# Patient Record
Sex: Male | Born: 1935 | Race: Black or African American | Hispanic: No | Marital: Married | Smoking: Never smoker
Health system: Southern US, Community
[De-identification: ages and names within clinical notes are randomized; demographics above are authoritative.]

## PROBLEM LIST (undated history)

## (undated) DIAGNOSIS — I2510 Atherosclerotic heart disease of native coronary artery without angina pectoris: Secondary | ICD-10-CM

## (undated) DIAGNOSIS — N189 Chronic kidney disease, unspecified: Secondary | ICD-10-CM

## (undated) DIAGNOSIS — N186 End stage renal disease: Secondary | ICD-10-CM

## (undated) DIAGNOSIS — M1990 Unspecified osteoarthritis, unspecified site: Secondary | ICD-10-CM

## (undated) DIAGNOSIS — D649 Anemia, unspecified: Secondary | ICD-10-CM

## (undated) DIAGNOSIS — R413 Other amnesia: Secondary | ICD-10-CM

## (undated) DIAGNOSIS — F32A Depression, unspecified: Secondary | ICD-10-CM

## (undated) DIAGNOSIS — K219 Gastro-esophageal reflux disease without esophagitis: Secondary | ICD-10-CM

## (undated) DIAGNOSIS — I10 Essential (primary) hypertension: Secondary | ICD-10-CM

## (undated) DIAGNOSIS — I219 Acute myocardial infarction, unspecified: Secondary | ICD-10-CM

## (undated) DIAGNOSIS — Z8719 Personal history of other diseases of the digestive system: Secondary | ICD-10-CM

## (undated) DIAGNOSIS — F329 Major depressive disorder, single episode, unspecified: Secondary | ICD-10-CM

## (undated) DIAGNOSIS — R0602 Shortness of breath: Secondary | ICD-10-CM

## (undated) DIAGNOSIS — I639 Cerebral infarction, unspecified: Secondary | ICD-10-CM

## (undated) DIAGNOSIS — J189 Pneumonia, unspecified organism: Secondary | ICD-10-CM

## (undated) HISTORY — DX: Cerebral infarction, unspecified: I63.9

## (undated) HISTORY — DX: Other amnesia: R41.3

## (undated) HISTORY — DX: Chronic kidney disease, unspecified: N18.9

## (undated) HISTORY — DX: Atherosclerotic heart disease of native coronary artery without angina pectoris: I25.10

## (undated) HISTORY — PX: ARTERIOVENOUS GRAFT PLACEMENT: SUR1029

## (undated) HISTORY — PX: DIALYSIS FISTULA CREATION: SHX611

## (undated) HISTORY — PX: LAPAROSCOPIC PARTIAL HEPATECTOMY: SHX5909

## (undated) HISTORY — DX: Essential (primary) hypertension: I10

---

## 2000-08-26 HISTORY — PX: CORONARY ARTERY BYPASS GRAFT: SHX141

## 2000-09-10 ENCOUNTER — Inpatient Hospital Stay (HOSPITAL_COMMUNITY): Admission: AD | Admit: 2000-09-10 | Discharge: 2000-09-22 | Payer: Self-pay | Admitting: *Deleted

## 2000-09-11 ENCOUNTER — Encounter: Payer: Self-pay | Admitting: *Deleted

## 2000-09-13 ENCOUNTER — Encounter: Payer: Self-pay | Admitting: *Deleted

## 2000-09-14 ENCOUNTER — Encounter: Payer: Self-pay | Admitting: Thoracic Surgery (Cardiothoracic Vascular Surgery)

## 2000-09-15 ENCOUNTER — Encounter: Payer: Self-pay | Admitting: Cardiothoracic Surgery

## 2000-09-16 ENCOUNTER — Encounter: Payer: Self-pay | Admitting: Cardiothoracic Surgery

## 2000-09-17 ENCOUNTER — Encounter: Payer: Self-pay | Admitting: Cardiothoracic Surgery

## 2000-09-18 ENCOUNTER — Encounter: Payer: Self-pay | Admitting: Cardiothoracic Surgery

## 2000-09-19 ENCOUNTER — Encounter: Payer: Self-pay | Admitting: Cardiothoracic Surgery

## 2000-09-20 ENCOUNTER — Encounter: Payer: Self-pay | Admitting: Cardiothoracic Surgery

## 2004-01-04 ENCOUNTER — Ambulatory Visit (HOSPITAL_COMMUNITY): Admission: RE | Admit: 2004-01-04 | Discharge: 2004-01-04 | Payer: Self-pay | Admitting: Family Medicine

## 2009-05-22 ENCOUNTER — Ambulatory Visit: Payer: Self-pay | Admitting: Cardiovascular Disease

## 2009-06-05 ENCOUNTER — Ambulatory Visit: Payer: Self-pay

## 2009-06-05 ENCOUNTER — Encounter: Payer: Self-pay | Admitting: Cardiovascular Disease

## 2009-06-05 DIAGNOSIS — N189 Chronic kidney disease, unspecified: Secondary | ICD-10-CM

## 2009-06-20 ENCOUNTER — Telehealth (INDEPENDENT_AMBULATORY_CARE_PROVIDER_SITE_OTHER): Payer: Self-pay

## 2009-06-21 ENCOUNTER — Ambulatory Visit: Payer: Self-pay

## 2009-06-21 ENCOUNTER — Ambulatory Visit: Payer: Self-pay | Admitting: Cardiology

## 2009-06-21 ENCOUNTER — Encounter: Payer: Self-pay | Admitting: Cardiovascular Disease

## 2009-06-21 ENCOUNTER — Ambulatory Visit (HOSPITAL_COMMUNITY): Admission: RE | Admit: 2009-06-21 | Discharge: 2009-06-21 | Payer: Self-pay | Admitting: Cardiovascular Disease

## 2009-06-28 ENCOUNTER — Telehealth: Payer: Self-pay | Admitting: Cardiovascular Disease

## 2009-06-28 ENCOUNTER — Ambulatory Visit: Payer: Self-pay | Admitting: Cardiovascular Disease

## 2009-09-28 ENCOUNTER — Ambulatory Visit: Payer: Self-pay | Admitting: Cardiovascular Disease

## 2009-12-18 ENCOUNTER — Ambulatory Visit: Payer: Self-pay | Admitting: Surgery

## 2009-12-27 ENCOUNTER — Ambulatory Visit (HOSPITAL_COMMUNITY): Admission: RE | Admit: 2009-12-27 | Discharge: 2009-12-27 | Payer: Self-pay | Admitting: Surgery

## 2010-01-08 ENCOUNTER — Ambulatory Visit: Payer: Self-pay | Admitting: Surgery

## 2010-01-25 ENCOUNTER — Ambulatory Visit: Payer: Self-pay | Admitting: Cardiovascular Disease

## 2010-02-12 ENCOUNTER — Ambulatory Visit: Payer: Self-pay | Admitting: Surgery

## 2010-04-09 ENCOUNTER — Ambulatory Visit: Payer: Self-pay | Admitting: Surgery

## 2010-04-18 ENCOUNTER — Ambulatory Visit (HOSPITAL_COMMUNITY): Admission: RE | Admit: 2010-04-18 | Discharge: 2010-04-18 | Payer: Self-pay | Admitting: Surgery

## 2010-04-18 ENCOUNTER — Ambulatory Visit: Payer: Self-pay | Admitting: Surgery

## 2010-04-27 ENCOUNTER — Ambulatory Visit (HOSPITAL_COMMUNITY)
Admission: RE | Admit: 2010-04-27 | Discharge: 2010-04-27 | Payer: Self-pay | Source: Home / Self Care | Admitting: Surgery

## 2010-05-04 ENCOUNTER — Ambulatory Visit: Payer: Self-pay | Admitting: Cardiovascular Disease

## 2010-05-21 ENCOUNTER — Ambulatory Visit: Payer: Self-pay

## 2010-05-21 ENCOUNTER — Ambulatory Visit: Payer: Self-pay | Admitting: Internal Medicine

## 2010-05-21 ENCOUNTER — Encounter: Payer: Self-pay | Admitting: Cardiovascular Disease

## 2010-05-21 ENCOUNTER — Ambulatory Visit (HOSPITAL_COMMUNITY): Admission: RE | Admit: 2010-05-21 | Discharge: 2010-05-21 | Payer: Self-pay | Admitting: Cardiovascular Disease

## 2010-05-22 ENCOUNTER — Telehealth: Payer: Self-pay | Admitting: Cardiovascular Disease

## 2010-05-28 ENCOUNTER — Ambulatory Visit: Payer: Self-pay | Admitting: Surgery

## 2010-07-24 ENCOUNTER — Telehealth: Payer: Self-pay | Admitting: Cardiovascular Disease

## 2010-09-03 ENCOUNTER — Ambulatory Visit
Admission: RE | Admit: 2010-09-03 | Discharge: 2010-09-03 | Payer: Self-pay | Source: Home / Self Care | Attending: Cardiovascular Disease | Admitting: Cardiovascular Disease

## 2010-09-25 NOTE — Progress Notes (Signed)
Summary: meds  Phone Note Call from Patient   Caller: Spouse Summary of Call: Wife called-patient has been out of Lipitor and Plavix for a while now due to being unable to pay for it.  Was told that Lipitor is going generic soon.  Wife wants to know if patient can be given samples of something else until Lipitor is generic.  Will notify Dr. Kirke Corin.  Initial call taken by: Dessie Coma  LPN,  July 24, 2010 1:54 PM  Follow-up for Phone Call        Phone Call Completed:  spoke with patient's wife.  Per Dr. Kirke Corin, patient needs to take Aspirin 81mg  once daily as a  substitute for the Plavix.  Also may take Crestor 20mg  samples until Lipitor becomes generic this month.  Follow-up by: Dessie Coma  LPN,  July 24, 2010 2:40 PM

## 2010-09-25 NOTE — Progress Notes (Signed)
  Phone Note Outgoing Call   Call placed by: Dessie Coma  LPN,  May 22, 2010 2:54 PM Call placed to: Patient Summary of Call: Patient notified per Dr. Kirke Corin, echo is fine.

## 2010-10-12 ENCOUNTER — Ambulatory Visit (INDEPENDENT_AMBULATORY_CARE_PROVIDER_SITE_OTHER): Payer: Medicare Other | Admitting: Cardiovascular Disease

## 2010-10-12 DIAGNOSIS — I2510 Atherosclerotic heart disease of native coronary artery without angina pectoris: Secondary | ICD-10-CM

## 2010-10-12 DIAGNOSIS — I10 Essential (primary) hypertension: Secondary | ICD-10-CM

## 2010-10-12 DIAGNOSIS — E7800 Pure hypercholesterolemia, unspecified: Secondary | ICD-10-CM

## 2010-10-12 DIAGNOSIS — Z01810 Encounter for preprocedural cardiovascular examination: Secondary | ICD-10-CM

## 2010-10-23 ENCOUNTER — Telehealth (INDEPENDENT_AMBULATORY_CARE_PROVIDER_SITE_OTHER): Payer: Self-pay | Admitting: Radiology

## 2010-10-24 ENCOUNTER — Encounter: Payer: Self-pay | Admitting: Cardiology

## 2010-10-24 ENCOUNTER — Ambulatory Visit (HOSPITAL_COMMUNITY): Payer: Medicare Other | Attending: Dermatopathology

## 2010-10-24 DIAGNOSIS — I4510 Unspecified right bundle-branch block: Secondary | ICD-10-CM

## 2010-10-24 DIAGNOSIS — R0989 Other specified symptoms and signs involving the circulatory and respiratory systems: Secondary | ICD-10-CM | POA: Insufficient documentation

## 2010-10-24 DIAGNOSIS — I2510 Atherosclerotic heart disease of native coronary artery without angina pectoris: Secondary | ICD-10-CM

## 2010-10-24 DIAGNOSIS — I491 Atrial premature depolarization: Secondary | ICD-10-CM

## 2010-10-24 DIAGNOSIS — R0609 Other forms of dyspnea: Secondary | ICD-10-CM | POA: Insufficient documentation

## 2010-11-01 NOTE — Progress Notes (Signed)
Summary: Nuc Pre-Procedure  Phone Note Outgoing Call Call back at Home Phone [phone]   Call placed by: Henrine Screws Call placed to: Patient Reason for Call: Confirm/change Appt Summary of Call: Reviewed information on Myoview Information Sheet (see scanned document for further details).  Spoke with patient.     Nuclear Med Background Indications for Stress Test: Evaluation for Ischemia, Surgical Clearance, Graft Patency  Indications Comments: Pending Kidney transplant- [facility]  History: CABG, Echo, Heart Catheterization, Myocardial Perfusion Study  History Comments: 2002- Cath and CABG 2010- MPS- No ischemia.Nl. EF 9/11- Echo- EF= 55-60%  Symptoms: DOE    Nuclear Pre-Procedure Cardiac Risk Factors: CVA, Hypertension, Lipids, NIDDM, RBBB, TIA

## 2010-11-01 NOTE — Assessment & Plan Note (Signed)
Summary: Cardiology Nuclear Testing  Nuclear Med Background Indications for Stress Test: Evaluation for Ischemia, Surgical Clearance, Graft Patency  Indications Comments: Pending kidney transplant at [facility]  History: Abnormal EKG, CABG, Echo, Heart Catheterization, Myocardial Perfusion Study  History Comments: '02 CABG; '10 MPS:no ischemia, normal EF; '10 Dob. Echo:normal; '11 Echo:EF=55-60%  Symptoms: DOE    Nuclear Pre-Procedure Cardiac Risk Factors: CVA, Hypertension, Lipids, NIDDM, Obesity, RBBB, TIA Caffeine/Decaff Intake: None NPO After: 12:00 AM Lungs: Clear.  O2 Sat 96% on RA. IV 0.9% NS with Angio Cath: 20g     IV Site: R Forearm IV Started by: Stanton Kidney, EMT-P Chest Size (in) 46     Height (in): 66 Weight (lb): 202 BMI: 32.72  Nuclear Med Study 1 or 2 day study:  1 day     Stress Test Type:  Eugenie Birks Reading MD:  Marca Ancona, MD     Referring MD:  Lorine Bears, MD Resting Radionuclide:  Technetium 67m Tetrofosmin     Resting Radionuclide Dose:  10.9 mCi  Stress Radionuclide:  Technetium 28m Tetrofosmin     Stress Radionuclide Dose:  33.0 mCi   Stress Protocol   Lexiscan: 0.4 mg   Stress Test Technologist:  Rea [employer], CMA-N     Nuclear Technologist:  Domenic Polite, CNMT  Rest Procedure  Myocardial perfusion imaging was performed at rest 45 minutes following the intravenous administration of Technetium 10m Tetrofosmin.  Stress Procedure  The patient received IV Lexiscan 0.4 mg over 15-seconds.  Technetium 64m Tetrofosmin injected at 30-seconds.  There were no significant changes with infusion, other than occasional PAC's.  Quantitative spect images were obtained after a 45 minute delay.  QPS Raw Data Images:  Normal; no motion artifact; normal heart/lung ratio. Stress Images:  Small apical perfusion defect Rest Images:  Small apical perfusion defect, less marked than stress.  Subtraction (SDS):  Small partially reversible apical perfusion defect.    Transient Ischemic Dilatation:  1.12  (Normal <1.22)  Lung/Heart Ratio:  0.30  (Normal <0.45)  Quantitative Gated Spect Images QGS EDV:  141 ml QGS ESV:  55 ml QGS EF:  61 % QGS cine images:  Normal wall motion.    Overall Impression  Exercise Capacity: Lexiscan with no exercise. BP Response: Normal blood pressure response. Clinical Symptoms: short of breath ECG Impression: NSR with RBBB, no significant change with infusion.  Overall Impression: Small partially reversible apical perfusion defect. Apical thinning versus small area of ischemia.  Low risk study.

## 2010-11-07 ENCOUNTER — Encounter (INDEPENDENT_AMBULATORY_CARE_PROVIDER_SITE_OTHER): Payer: Medicare Other

## 2010-11-07 ENCOUNTER — Encounter: Payer: Self-pay | Admitting: Cardiovascular Disease

## 2010-11-07 ENCOUNTER — Other Ambulatory Visit (HOSPITAL_COMMUNITY): Payer: No Typology Code available for payment source

## 2010-11-07 DIAGNOSIS — G459 Transient cerebral ischemic attack, unspecified: Secondary | ICD-10-CM

## 2010-11-07 DIAGNOSIS — N186 End stage renal disease: Secondary | ICD-10-CM

## 2010-11-07 DIAGNOSIS — I6529 Occlusion and stenosis of unspecified carotid artery: Secondary | ICD-10-CM

## 2010-11-08 LAB — POCT I-STAT, CHEM 8
Calcium, Ion: 1.29 mmol/L (ref 1.12–1.32)
Chloride: 109 mEq/L (ref 96–112)
Creatinine, Ser: 4.3 mg/dL — ABNORMAL HIGH (ref 0.4–1.5)
Glucose, Bld: 134 mg/dL — ABNORMAL HIGH (ref 70–99)
HCT: 30 % — ABNORMAL LOW (ref 39.0–52.0)

## 2010-11-08 LAB — POCT I-STAT 4, (NA,K, GLUC, HGB,HCT)
Glucose, Bld: 152 mg/dL — ABNORMAL HIGH (ref 70–99)
HCT: 34 % — ABNORMAL LOW (ref 39.0–52.0)
Hemoglobin: 11.6 g/dL — ABNORMAL LOW (ref 13.0–17.0)
Potassium: 4.1 mEq/L (ref 3.5–5.1)
Sodium: 143 mEq/L (ref 135–145)

## 2010-11-08 LAB — GLUCOSE, CAPILLARY: Glucose-Capillary: 114 mg/dL — ABNORMAL HIGH (ref 70–99)

## 2010-11-13 LAB — POCT I-STAT 4, (NA,K, GLUC, HGB,HCT)
Glucose, Bld: 133 mg/dL — ABNORMAL HIGH (ref 70–99)
HCT: 29 % — ABNORMAL LOW (ref 39.0–52.0)
Hemoglobin: 9.9 g/dL — ABNORMAL LOW (ref 13.0–17.0)

## 2010-11-13 LAB — GLUCOSE, CAPILLARY: Glucose-Capillary: 123 mg/dL — ABNORMAL HIGH (ref 70–99)

## 2010-11-13 NOTE — Miscellaneous (Signed)
Summary: Orders Update  Clinical Lists Changes  Problems: Added new problem of TIA (ICD-435.9) Orders: Added new Test order of Carotid Duplex (Carotid Duplex) - Signed Added new Test order of Abdominal Aorta Duplex (Abd Aorta Duplex) - Signed

## 2010-11-16 NOTE — Assessment & Plan Note (Signed)
[facility]                       [place] CARDIOLOGY OFFICE NOTE  DEKLYN, GIBBON                        MRN:          [id number] DATE:10/12/2010                            DOB:          11/30/1930   Mr. Borba is a 77 year old gentleman who is here today for a followup visit regarding preoperative cardiovascular evaluation for a planned kidney transplant.  He has the following problem list: 1. Coronary artery disease, status post coronary artery bypass graft     surgery in 2002, with no catheterizations or interventions since     then.  Most recent stress test was in October 2010, which showed no     evidence of ischemia with normal ejection fraction. 2. Chronic kidney disease with a creatinine of 3.5.  Followed by Dr.     Marina Gravel at [facility].  He is being evaluated     for a kidney transplant at [facility]. 3. Hypertension. 4. History of previous cerebrovascular accident. 5. Type 2 diabetes.  INTERVAL HISTORY:  The patient overall has been relatively stable.  He has not had any recent episodes of chest pain.  He continues to have dyspnea with minimal activities as well as orthopnea.  Many of his symptoms were felt before to be due to a diastolic heart failure and mild fluid overload.  The patient's functional capacity is reduced and he does not exercise on a regular basis.  He has not had any dizziness, syncope or presyncope.  According to his wife, he does have episodes of apnea at night with loud snoring.  He has not been evaluated for sleep apnea in the past.  He has chronic lower extremity edema which has not changed recently.  MEDICATIONS: 1. Ranitidine 300 mg at bedtime. 2. KCl 20 mEq 3 tablets a day. 3. Allopurinol 100 mg once a day. 4. Vitamin D. 5. Calcitriol 0.5 mg once a day. 6. Nifedipine extended release 90 mg once a day. 7. Glipizide 5 mg twice daily. 8. Furosemide 80 mg twice  daily. 9. Finasteride 5 mg once a day. 10.Namenda 10 mg twice daily. 11.Plavix 75 mg once a day. 12.Hydralazine 10 mg twice daily. 13.Carvedilol 25 mg twice daily. 14.Lipitor 40 mg at bedtime.  ALLERGIES:  No known drug allergies.  PHYSICAL EXAMINATION:  GENERAL:  The patient appears to be at his stated age and in no acute distress. VITAL SIGNS:  Weight is 202 pounds which is unchanged from his previous weight, blood pressure is 131/64, pulse is 62, oxygen saturation is 97% on room air. HEENT:  Normocephalic and atraumatic. NECK:  No JVD or carotid bruits. RESPIRATORY:  Normal respiratory effort with no use of accessory muscles.  Auscultation reveals normal breath sounds.  CARDIOVASCULAR: Normal PMI.  Normal S1 and S2 with no gallops or murmurs. ABDOMEN:  Benign, nontender, nondistended. EXTREMITIES:  With +1 edema bilaterally which is slightly worse on the left side. SKIN:  There is mild chronic stasis dermatitis in the lower extremities. Otherwise, there is no other rash. PSYCHIATRIC:  He is alert, oriented x3 with normal mood and affect.  An electrocardiogram  was performed which showed normal sinus rhythm with right bundle-branch block, possible old inferior infarct as well as T- wave changes in the lateral leads suggestive of ischemia.  His EKG is not significantly changed from before.  IMPRESSION: 1. Preoperative cardiovascular evaluation for a kidney transplant:     Overall, the patient's symptoms have been stable.  He has known     history of coronary artery disease, but no recent cardiac events.     His functional capacity is poor.  His symptoms include dyspnea with     minimal activities which was thought to be in the past due to     diastolic heart failure, fluid overload as well as general     deconditioning.  His most recent echocardiogram was done in     September 2011, which showed normal LV systolic function with mild     left ventricular hypertrophy, mild  diastolic dysfunction and mildly     dilated left atrium.  There was no significant valvular     abnormalities.  I recommend obtaining a Lexiscan nuclear stress     test to rule out underlying ischemia.  If the stress test comes     back normal, or has no high-risk features, then the patient would     be considered at low-risk for cardiovascular complications.  In     terms of his medications, he is already on good medications.  He is     taking Plavix once daily, but that actually can be stopped if     needed for the surgery given that he had no recent stents done or     angioplasty.  He has not been on aspirin probably due to his     chronic kidney disease.  If Plavix is stopped and I recommend that     he goes on aspirin 81 mg once daily. 2. Previous transient ischemic attack:  We will obtain a carotid     Doppler also part of the transplant evaluation.  The patient has no     recent cerebrovascular accident.  We will also obtain an iliac     arterial ultrasound Doppler which is requested by the transplant     team. 3. Possible underlying sleep apnea:  The patient will need evaluation     with a sleep study once his other issues are addressed.  If he does     have sleep apnea, it might be affecting his blood pressure control     as well as dyspnea and diastolic heart failure. 4. Hyperlipidemia:  We will continue treatment with Lipitor.  His most     recent lipid profile in     September 2008, reveals a total cholesterol of 113, HDL of 35,     triglyceride 129 and LDL of     52.  It was all within optimal range except for low HDL.  This will     be monitored for now.  The patient will follow up in 4 months from     now or earlier if needed.    Lorine Bears, MD Electronically Signed   MA/MedQ  DD: 10/12/2010  DT: 10/13/2010  Job #: [id number]

## 2011-01-08 NOTE — Assessment & Plan Note (Signed)
[facility]                        [place] CARDIOLOGY OFFICE NOTE   SHEAN, GERDING                          MRN:          [id number]  DATE:09/28/2009                            DOB:          1931-07-10    PROBLEM LIST:  1. Coronary artery disease status post coronary artery bypass graft in      2002.  Dobutamine stress echocardiogram in October 2010 showed      normal ejection fraction and no inducible ischemia with a very      hypertensive response to dobutamine.  2. Chronic renal insufficiency with creatinine of 3.5.  3. Hypertension.  The patient had a renal duplex scan in October 2010      ruling out renal artery stenosis.  4. Possible history of cerebrovascular accident.  5. Diabetes.   INTERVAL HISTORY:  The patient states since his last visit, he has been  getting along fairly well.  He denies any episodes of chest discomfort,  but does continue to have some dyspnea on exertion.  There is also some  chronic lower extremity edema.  He follows up with his nephrologist in 1  month.  He states that he has had some financial difficulties recently  and has been unable to take his Plavix, Crestor and Namenda.  He has  been compliant with all of his other medications including his  antihypertensives.   PHYSICAL EXAMINATION:  VITAL SIGNS:  His blood pressure is 131/64, his  pulse is 59, he is sating 97% on room air, and he weighs 201 pounds  which is 1 pound less than he weighed in November 2010.  GENERAL:  He is in no acute distress.  HEENT:  Normocephalic, atraumatic.  NECK:  Supple.  There is no JVD in the seated position.  There is no  carotid bruits.  HEART:  Regular rate and rhythm without murmur, rub or gallop.  LUNGS:  Clear bilaterally.  ABDOMEN:  Soft, nontender, nondistended.  EXTREMITIES:  1+ bilateral lower extremity edema.   ASSESSMENT AND PLAN:  1. Coronary artery disease.  The patient is not having any signs      consistent  with angina.  He was given samples of Plavix today and      he states he should be able to afford it after this month.  He      should continue on his beta-blocker and that he is given the      samples of Lipitor 40 mg to replace the Crestor 20 mg that he was      taking.  2. Hypertension.  His blood pressure is under good control.  He should      continue on his current medical regimen as listed above in the      chart.  3. Hyperlipidemia.  Today, the patient is changed from Crestor 20 mg      daily to Lipitor 40 mg daily.  The patient was given samples of      Lipitor 40 mg and he will contact our office if he can afford to go  back on the Crestor 20 mg after the next month or two.  If not, we      will check a fasting lipid profile and titrate the Lipitor as      tolerated.  4. History of possible cerebrovascular accident.  The patient states      an MRI of his brain, at which point Neurology changed him to      aspirin and Plavix.  Again, samples were given      today for approximately 1 month's of Plavix.  At the end of that      month, the patient states he should be able to resume taking it.      If he cannot, the patient is advised to take aspirin instead of the      Plavix.  We will see the patient back in 4 months' time.     Brayton El, MD  Electronically Signed    SGA/MedQ  DD: 09/28/2009  DT: 09/28/2009  Job #: [id number]

## 2011-01-08 NOTE — Assessment & Plan Note (Signed)
OFFICE VISIT   Shane [name], Shane [name]  DOB:  March 27, 1931                                       05/28/2010  ZOXWR#:[id number]   The patient is status post left radiocephalic fistula on 01/16/2010.  This was slow to mature.  He was studied with a fistulogram which  revealed prominent branches which were ligated in the operating room by  Dr. Edilia Bo on 04/27/2010.  He is back today for followup.  There is  much more prominent thrill within his fistula today.  I am optimistic  this will be able to be used for dialysis.  I do not see any further  revisions that need to be done for this fistula.  I am not having him  come back to see me.  If there are problems with access we can  reevaluate this but there is nothing that can be done to improve his  existing fistula which I think is an acceptable conduit.     [name], MD  Electronically Signed   VWB/MEDQ  D:  05/28/2010  T:  05/29/2010  Job:  [id number]   cc:   Wilber Bihari. Caryn Section, M.D.

## 2011-01-08 NOTE — Assessment & Plan Note (Signed)
[facility]                        [place] CARDIOLOGY OFFICE NOTE   JAELIN, FACKLER                        MRN:          [id number]  DATE:09/03/2010                            DOB:          1930/12/08    Mr. Harbeck is a 77 year old gentleman who is here today for a followup  visit.  He has the following problem list:  1. Coronary artery disease status post coronary artery bypass graft      surgery in 2002, with no catheterization since then.  Most recent      stress test was in October of 2010, which showed no evidence of      ischemia with normal ejection fraction.  2. Chronic kidney disease with a creatinine of 3.5.  This is being      followed by Dr. Marina Gravel at [facility] in      [place].  He has been evaluated recently for a kidney transplant      at [facility].  3. Hypertension.  4. History of cerebrovascular accident.  5. Type 2 diabetes.   INTERVAL HISTORY:  The patient is here today for a followup visit.  Overall, he has been relatively stable.  He does not have any chest  pain.  He does have dyspnea with minimal activities which has not been  changed.  I did perform an echocardiogram recently which showed normal  ejection fraction, mild left ventricular hypertrophy, and evidence of  diastolic dysfunction.  He also has chronic lower extremity edema.   PHYSICAL EXAMINATION:  VITAL SIGNS:  Weight is 204.8 pounds, blood  pressure is 135/65, pulse is 60, oxygen saturation is 96% on room air.  NECK:  Reveals no JVD or carotid bruits.  LUNGS:  Clear to auscultation.  HEART:  Regular rate and rhythm with no gallops or murmurs.  ABDOMEN:  Benign, nontender, nondistended.  EXTREMITIES:  With +2 edema bilaterally.   IMPRESSION:  1. Coronary artery disease:  Currently, he is not having any      convincing symptoms of angina.  Although, he does have dyspnea with      minimal activities.  This has not changed since his  last visit.      His most recent stress test was in October of 2010, which showed no      evidence of ischemia.  We will continue with medical therapy for      now.  If the patient is to be listed for a kidney transplant, then      a stress test will probably be needed before that.  2. Hypertension:  His blood pressure is better controlled since last      visit.  I increased his hydralazine to 25 mg twice daily.  3. Hyperlipidemia:  He is currently taking Crestor 20 mg at bedtime      and we are giving him samples because he could not afford Lipitor.      We will likely soon switch him to generic atorvastatin once he runs      out of his supplies.  He does have a  low HDL, but that will be      treated medically.  I think based on the results of the [employer]      trial, the benefit of Niaspan      in this situation would be minimum.  He will follow up in 4 months      from now or earlier if needed.     Lorine Bears, MD  Electronically Signed    MA/MedQ  DD: 09/03/2010  DT: 09/04/2010  Job #: [id number]

## 2011-01-08 NOTE — Assessment & Plan Note (Signed)
[facility]                         CARDIOLOGY OFFICE NOTE   ANTWON, ROCHIN                          MRN:          [id number]  DATE:05/22/2009                            DOB:          05-Feb-1931    CHIEF COMPLAINT:  Dyspnea on exertion.   HISTORY OF PRESENT ILLNESS:  Mr. Zarazua is a 77 year old black male with  past medical history significant for coronary artery disease, status  post CABG in 2002, questionable history of renal artery stenosis,  hypertension, hyperlipidemia, chronic renal disease (creatinine of 3.49)  in September 2010, who is presenting with increased dyspnea on exertion  times several months.  The patient states since his bypass surgery in  2002, he has been getting along fairly well.  He has had no episodes of  chest discomfort whatsoever. He states that he has walked for 10-50  minutes, but has noticed that as he does so, his dyspnea on exertion has  been increasing over the past several months.  He also states that if he  walks up as few as 5 steps of stairs, he notices dyspnea on exertion  whereas this was not the case several months ago.  He has had no  increase in lower extremity edema and he denies any PND or orthopnea.  He also denies any dizziness or syncopal episodes.  He has had a recent  rise in his creatinine from 2.2 in December 2007 to 3.49 in September  2010.  The patient also states that he had a stress test several years  ago at [facility], but has not had any cardiac workup recently.   PAST MEDICAL HISTORY:  As above in HPI.  Also, the patient has diabetes,  BPH, secondary PTH.  Regarding his renal artery stenosis, he has 60%  stenosis on a MRA done in May 2005 at [facility]; however, a duplex scan  at [facility] showed no evidence of increased velocities bilaterally.   SOCIAL HISTORY:  No tobacco.  No alcohol.   FAMILY HISTORY:  Negative for premature coronary artery disease.   ALLERGIES:   No known drug allergies.   MEDICATIONS:  1. Aspirin 81 mg daily.  2. Finasteride 5 mg daily.  3. Lasix 80 mg b.i.d.  4. Crestor 10 mg nightly.  5. Coreg 25 mg every morning and 12.5 mg every evening.  6. Nifedipine ER 90 mg daily.  7. Glipizide XL 5 mg b.i.d.  8. Calcitriol.  9. Vitamin D.  10.Allopurinol.  11.Potassium.  12.Ranitidine.   REVIEW OF SYSTEMS:  Positive for a history of wheezing that was relieved  once the patient started on ranitidine.  He denies any hematochezia,  melena, chest pain, or syncope.  Other systems as in HPI, otherwise  negative.   PHYSICAL EXAMINATION:  VITAL SIGNS:  He has a blood pressure of 171/75,  although he states this is usually much lower, his pulse is 65, he  weighs 198 pounds, and he is sating 99% on room air.  GENERAL:  He is in no acute distress.  HEENT:  Nonfocal.  NECK:  Supple.  There  is no JVD.  There are no carotid bruits.  HEART:  Regular rate and rhythm with an occasional S3.  There is no  murmur, rub, or gallop.  LUNGS:  Clear bilaterally.  ABDOMEN:  Soft, nontender, and nondistended.  EXTREMITIES:  He has trace bilateral lower extremity edema.  He does  have increased ankle edema on his right lower extremity secondary to an  old injury.   EKG from today independently reviewed by myself demonstrates normal  sinus rhythm with a right bundle-branch block and occasional PVCs.  There is a T-wave inversion in the inferior, anterior, and lateral  leads.  There is currently no old EKG for comparison.   Labs from Dr. Scherrie Gerlach office show white count 4000, hemoglobin 11,  platelet count 162, HDL is 48, LDL is 93, triglycerides 154, hemoglobin  A1c is 7.4.   ASSESSMENT:  A 77 year old black male with known coronary artery disease  and chronic renal insufficiency presenting with several month history of  worsening dyspnea on exertion.  Although the patient has not had any  chest discomfort, this increased shortness of breath could be  secondary  to ischemia.  It is also quite possible that this shortness of breath is  secondary to increasing volume overload, secondary to progressive renal  failure.   PLAN:  Today, we would like to order a dobutamine stress echo in order  to evaluate the patient for inducible ischemia.  We will also order a  renal artery duplex study in order to evaluate the renal arteries for  obstructive atherosclerotic disease.  He should continue his medications  as listed above and we will see the patient back in clinic proximally 1  week's time after the results of the dobutamine stress echo are  obtained.     Brayton El, MD  Electronically Signed    SGA/MedQ  DD: 05/22/2009  DT: 05/23/2009  Job #: [id number]

## 2011-01-08 NOTE — Assessment & Plan Note (Signed)
OFFICE VISIT   Shane [name], Shane [name]  DOB:  12-May-1931                                       02/12/2010  EAVWU#:[id number]   The patient returns today.  He is status post left radiocephalic fistula  on Dec 27, 2009.  He is not yet on dialysis.  He had an ultrasound today  that shows several small branches.  There is elevated velocity at the  arterial anastomosis; however, this most likely correlates to the vein  caliber.   On exam, the fistula is palpable distally; however, as you move up the  arm, it becomes less prominent.   I believe the next step is going to be to proceed with a fistulogram;  however, since the patient is not yet on dialysis, I would like to delay  this.  I am going to have him come back to see me in 6 weeks.  I did  discuss today that I am a little concerned that this may be all due to  the caliber of vein.  He may need to have his fistula redone in the  antecubital region.  We will make that decision based on how it looks in  6 weeks.     [name], MD  Electronically Signed   VWB/MEDQ  D:  02/12/2010  T:  02/13/2010  Job:  [id number]   cc:   Marina Gravel

## 2011-01-08 NOTE — Assessment & Plan Note (Signed)
[facility]                        [place] CARDIOLOGY OFFICE NOTE   DEDRICK, HEFFNER                        MRN:          [id number]  DATE:05/04/2010                            DOB:          October 28, 1930    PROBLEMS LIST:  1. Coronary artery disease status post coronary artery bypass graft      surgery in 2002.  No catheterization since then.  Most recent      stress test was in October 2010, which showed normal ejection      fraction with no evidence of ischemia.  2. Chronic kidney disease with a creatinine of 3.5.  This is being      followed by Dr. Marina Gravel at [facility] in      [place].  3. Hypertension.  4. History of cerebrovascular accident.  5. Type 2 diabetes.   INTERVAL HISTORY:  The patient is here today for a followup visit.  There has been no chest pain.  He does have dyspnea with minimal  activities which seems to be chronic but has been getting worse over the  last year.  He has stable 2-pillow orthopnea.  There is no PND.  He does  have lower extremity edema, but that has not changed recently.  He still  makes good amount of urine with furosemide.  He denies any syncope or  presyncope.  He has what seems to be a functioning AV fistula in the  left arm in anticipation for dialysis in the future.  His blood pressure  has been better than before, but has somewhat been running high  recently.   MEDICATIONS:  1. Ranitidine 300 mg once daily.  2. KCl 20 mEq 2 tablets a day.  3. Allopurinol 100 mg once daily.  4. Vitamin D.  5. Calcitriol  6. Nifedipine extended release 90 mg once daily.  7. Glipizide XL 5 mg twice daily.  8. Furosemide 80 mg twice a day.  9. Finasteride 5 mg once daily.  10.Namenda 10 mg twice daily.  11.Plavix 75 mg once daily.  12.Hydralazine 10 mg twice daily.  This will be increased today to 25      mg twice daily.  13.Carvedilol 25 mg twice daily.  14.Lipitor 40 mg at bedtime.   PHYSICAL EXAMINATION:  VITAL SIGNS:  Weight is 202.4 pounds, blood  pressure is 155/76, pulse is 61, oxygen saturation is 98% on room air.  NECK:  Reveals no JVD or carotid bruits.  LUNGS:  Clear to auscultation.  HEART:  Regular rate and rhythm with no gallops.  There is a 1/6  systolic ejection murmur at the aortic area.  ABDOMEN:  Benign, nontender, and nondistended.  EXTREMITIES:  +1 edema bilaterally.   IMPRESSION:  1. Coronary artery disease:  The patient has no symptoms suggestive of      angina.  He does have dyspnea with minimal activities which seems      to have been progressive over the last year.  This could be due to      his underlying chronic kidney disease and physical deconditioning.  However, I would like to obtain an echocardiogram to evaluate his      LV systolic function and pulmonary pressure.  In the meantime, we      will continue with Plavix 75 mg daily.  He is not on aspirin at      this time.  We will continue treatment of his risk factors.  2. Hypertension.  His blood pressure is elevated.  We will increase      hydralazine to 25 mg twice daily.  I would like to avoid increasing      nifedipine at this time due to his lower extremity edema.  3. Hyperlipidemia.  He is on Lipitor.  Most recent lipid profile      showed a total cholesterol of 113, HDL 35, triglyceride 129, and      LDL of 52.  His LDL is at target of less than 70.  HDL is mildly      low at 35 which ideally should be above 40.  I asked him to try to      increase his physical activities.  We will consider adding Niaspan      in the future but at this level, I do not think it is warranted.      The patient will follow up in 4 months from now or earlier if      needed.     Lorine Bears, MD  Electronically Signed    MA/MedQ  DD: 05/04/2010  DT: 05/05/2010  Job #: [id number]

## 2011-01-08 NOTE — Assessment & Plan Note (Signed)
OFFICE VISIT   Shane [name], Shane [name]  DOB:  29-Jun-1931                                       01/08/2010  QIONG#:[id number]   Patient underwent left radiocephalic fistula placed on 12/27/2009.  He  comes in today for his first postoperative visit.  He has minimal pain.  He has no signs of steal syndrome.   His incision is well-healed.  There is no evidence of infection.  I can  palpate his fistula coming across from lateral to medial on his forearm.  The vein has not become significantly dilated at this point in time;  however, there is a good thrill, and it is very early in his immediate  postoperative course.   I will plan on seeing him back in 5 weeks with an ultrasound.     [name], MD  Electronically Signed   VWB/MEDQ  D:  01/08/2010  T:  01/09/2010  Job:  [id number]

## 2011-01-08 NOTE — Assessment & Plan Note (Signed)
OFFICE VISIT   Shane [name], Shane [name]  DOB:  [birth year]                                       12/18/2009  CHART#:[id number]   REASON FOR VISIT:  Dialysis access.   REFERRING PHYSICIAN:  Dr. Caryn Section.   HISTORY:  This is a 77 year old gentleman with chronic kidney disease  secondary to diabetes and hypertension.  He is not yet on dialysis.  Comes in today to discuss options for access.  He is right-handed.   The patient has a history of coronary artery disease, he is status post  coronary artery bypass graft Dr. Donata Clay in 2002.  He is on oral  agents for his diabetes.  He is also on medication for  hypercholesterolemia which has been stable.   REVIEW OF SYSTEMS:  MUSCULOSKELETAL:  Positive for arthritis, joint pain  and muscle pain.  PSYCH:  Positive for depression.  ENT:  Positive for change in hearing.  All other review systems are negative as documented in the encounter  form.   PAST MEDICAL HISTORY:  Diabetes, hypertension, hypercholesterolemia,  previous heart attack, coronary artery disease, renal failure.   FAMILY HISTORY:  Negative for cardiovascular disease at an early age.   SOCIAL HISTORY:  Smokes occasional cigar, occasional alcohol.   PHYSICAL EXAM:  Heart rate 58, blood pressure 167/84, temperature is  98.0.  General:  Well-appearing, no distress.  HEENT:  Within normal  limits.  Lungs:  Clear bilaterally.  Cardiovascular:  Regular rate and  rhythm, no murmur.  Abdomen:  Soft and nontender.  Musculoskeletal:  Without major deformity.  Neuro:  He has no focal deficits.  Skin is  without rash.  He has palpable radial and brachial pulses on the left.   DIAGNOSTICS:  Vein mapping was performed today and independently  reviewed.  The patient has a marginal vein on the left, it is measuring  0.23 at the wrist and up to 0.36 in the upper arm.   ASSESSMENT/PLAN:  Chronic kidney disease needing access.  Plan; I  discussed with the patient today  beginning with a radiocephalic fistula  on the left.  We discussed that he may require future interventions for  maturation potentially even requiring converting this to an upper arm  fistula.  Vein is somewhat small at the wrist, however, we are a ways  out from him requiring dialysis.  I think it would be best to start at  the wrist and so as not  to burn any bridges and should this not mature  hopefully his upper arm vein would have dilated to make it more  amendable to a fistula.  I plan on doing this operation next Wednesday,  May 4.     Shane Ny, MD  Electronically Signed   VWB/MEDQ  D:  12/18/2009  T:  12/19/2009  Job:  [id number]   cc:   Dr. Dorothyann Peng, M.D.

## 2011-01-08 NOTE — Assessment & Plan Note (Signed)
[facility]                         CARDIOLOGY OFFICE NOTE   SMITH, POTENZA                        MRN:          [id number]  DATE:01/25/2010                            DOB:          06/20/1931    PROBLEM LIST:  1. Coronary artery disease status post coronary artery bypass graft in      2002, dobutamine stress echo on October 2010, showed normal EF and      no inducible ischemia.  2. Chronic renal insufficiency with creatinine 3.5.  3. Hypertension and renal artery duplex in 2010, to rule out renal      artery stenosis.  4. Possible history of cerebrovascular accident.  5. Diabetes.   INTERVAL HISTORY:  The patient states he has been getting along well  since last visit.  He exercises occasionally but not for more than 5  minutes at a time.  He denies any chest discomfort, shortness of breath,  or change in lower extremity edema.  He has some mild chronic left ankle  edema.  He has been compliant with his medications.  He says he  occasionally runs out of the Plavix and Crestor, but usually is not  without them for more than a few days.  He recently had a left upper  extremity AV fistula placed in anticipation of dialysis, however, no  definitive date has been set.   SOCIAL HISTORY:  He does not use tobacco.   PHYSICAL EXAMINATION:  VITAL SIGNS:  Today, blood pressure 119/58, pulse  61, saturating 97% on room air, and he weighs 201 pounds which is stable  from February 2011.  GENERAL:  He is in no acute distress.  HEENT:  Normocephalic, atraumatic.  NECK:  Supple.  No JVD.  No carotid bruits.  HEART:  Regular rate and rhythm without murmur, rub, or gallop.  LUNGS:  Clear bilaterally.  ABDOMEN:  Soft, nontender, nondistended.  EXTREMITIES:  There is left lower extremity ankle edema that is chronic.  There is no right lower extremity edema.  He has a fistula with a  palpable thrill in his left upper extremity.  The incision for the  surgery is clean, dry, and intact.   LABORATORY DATA:  The patient has not had a recent lipid profile, but in  reviewing his November 2010 labs, his LDL was 95, triglycerides 409, HDL  was 41.   ASSESSMENT AND PLAN:  1. Coronary artery disease.  The patient is not having any angina.  He      should continue single antiplatelet therapy which is currently in      the form of aspirin as he had a previous stroke or transient      ischemic attack on aspirin.  He should continue on beta-blocker and      statin.  Hypertension, his blood pressure is under excellent      control.  He should continue on his current medical regimen.  2. Hyperlipidemia.  From a cost standpoint, Lipitor will be more      affordable for the patient, so we will switch his Crestor to  Lipitor.  He will be started on 40 mg every evening.  We will check      a fasting lipid profile in 3 months' time as this will likely need      to be increased to 80 mg.  3. History of possible cerebrovascular accident.  After an MRI of his      brain, the patient's neurologist switched him from aspirin to      Plavix.  He is given some sample of Plavix today and he is told      that if he runs out of Plavix, he should take aspirin on those      days.  We will see the patient back in 3 months' time unless      problems develops in the interim.     Brayton El, MD  Electronically Signed    SGA/MedQ  DD: 01/25/2010  DT: 01/25/2010  Job #: [id number]

## 2011-01-08 NOTE — Procedures (Signed)
VASCULAR LAB EXAM   INDICATION:  Follow up a left arm AV fistula.   HISTORY:  Diabetes:  Cardiac:  Hypertension:   EXAM:  Left arm AV fistula duplex.   IMPRESSION:  1. Patent left radiocephalic AV fistula with a mildly increased      velocity of 650 cm/sec noted at the distal forearm level outflow      vein.  This is most likely due to a decreased vessel diameter since      no significant stenosis is noted.  2. Patent branches of the left outflow vein, ranging in diameter from      0.31-0.57 cm, as described on the attached worksheet.   ___________________________________________  V. Charlena Cross, MD   CH/MEDQ  D:  02/14/2010  T:  02/14/2010  Job:  [id number]

## 2011-01-08 NOTE — Assessment & Plan Note (Signed)
OFFICE VISIT   Shane [name], Shane [name]  DOB:  04/06/1931                                       04/09/2010  OVFIE#:[id number]   Shane [name] returns today for follow-up.  He is status post left  radiocephalic fistula on Jan 16, 2010.  The patient continues to be  stable from his renal failure.  He is not to on dialysis currently.  Patient has been using his exercise ball and has noticed some more  prominent veins appear on his arm,   The patient continues to be medically managed for diabetes.  He states  his blood sugars run in the 120 range.  He also is medically managed for  his hypercholesterolemia and has recently changed from Crestor to  Lipitor.  With regards to his heart, he is now off aspirin and taking  Plavix.   REVIEW OF SYSTEMS:  VASCULAR:  Positive for history of mini stroke.  CARDIAC:  Positive for chest pain, shortness of breath when lying flat  and arrhythmia.  PULMONARY:  Positive for wheezing.  GU:  Positive for renal disease.  EENT:  Positive for recent change in hearing.   PHYSICAL EXAMINATION:  Heart rate 60, blood pressure 120/67, temperature  is 97.9.  GENERAL:  He is well-appearing, in no distress.  HEENT:  Within normal limits.  LUNGS:  Clear bilaterally.  CARDIOVASCULAR:  Regular rate and rhythm.  No carotid bruits.  ABDOMEN:  Soft, nontender.  MUSCULOSKELETAL:  Without major deformities.  SKIN:  Without rash.  Left arm fistula:  The cephalic vein is more readily palpable than  before.  There are more prominent venous collaterals on the arm.  Cephalic vein again continues to feel  small in caliber.   ASSESSMENT/PLAN:  Non maturing AV fistula.   PLAN:  The patient will be scheduled for a left arm fistulogram.  I will  plan on accessing near the arterial venous anastomosis and evaluating  centrally.  I told the patient that we would do this with minimal  contrast.  We would consider balloon assisted maturation, however, if  this  appears to be a sclerotic vein, he would most likely benefit from  an upper arm procedure.  I have scheduled his fistulogram for Wednesday  August 24.     [name], MD  Electronically Signed   VWB/MEDQ  D:  04/09/2010  T:  04/10/2010  Job:  [id number]

## 2011-01-08 NOTE — Assessment & Plan Note (Signed)
[name]                        [place] CARDIOLOGY OFFICE NOTE   SPARSH, CALLENS                          MRN:          [id number]  DATE:06/28/2009                            DOB:          1931-02-17    PROBLEM LIST:  1. Coronary artery disease status post coronary artery bypass graft in      2002.  2. Chronic renal insufficiency with creatinine of about 3.5.  3. Hypertension.  The patient had a renal duplex scan in October 2010,      ruling out renal artery stenosis.  His potassium levels have been      low.  4. Possible history of cerebrovascular accident.  However, we do not      have current documentation of this.  5. Diabetes.   INTERVAL HISTORY:  The patient states since the last visit, he has  actually been feeling better.  He has not had any episodes of chest  discomfort.  He continues to get some dyspnea on exertion, but it is  mild and is not activity limiting.  He endorsed chronic left lower  extremity edema that has been present for many years.  The patient  states he is compliant with his medications, but his wife states he has  not been checking his blood pressures at home.   Other systems are reviewed and are negative.   MEDICATIONS:  As listed in the chart.   PHYSICAL EXAMINATION:  VITAL SIGNS:  Blood pressure is 139/73, his pulse  is 63, his weight is 203 pounds and he is sating at 99% on room air.  GENERAL:  He is in no acute distress.  HEENT:  Nonfocal.  NECK:  Supple.  There is no JVD.  There are no carotid bruits.  HEART:  Regular rate and rhythm.  Distant, but no murmur, rub or gallop.  LUNGS:  Clear bilaterally.  ABDOMEN:  Soft, nontender, nondistended.  EXTREMITIES:  1+ left lower extremity edema and trace right lower  extremity edema.   Review of the patient's dobutamine stress echo since his last visit  showed a normal ejection fraction of 60%, no evidence of inducible  ischemia, but a hypertensive response  to dobutamine with blood pressures  going up to 228/85.  I reviewed the patient's renal artery duplex study,  showed that there was 1-59% right renal artery stenosis and no evidence  of stenosis on the left.   ASSESSMENT AND PLAN:  1. Coronary artery disease.  The patient is on Plavix, a statin, and      the beta-blocker.  His ejection fraction is preserved and he is not      having symptoms of angina.  2. Hypertension.  The patient's hypertension is better today.      However, he did have a very hypertensive response to dobutamine and      his blood pressure was elevated at the last clinic visit.  We will      ask him to increase his Coreg to 25 mg b.i.d. from the 25 mg in the      morning and 12.5  mg in the evening that he is currently taking.  He      should continue on nifedipine ER 90 mg daily.  We will check a      renin-aldosterone level as well as a BNP today in order to further      evaluate for causes of secondary hypertension.  The patient is to      write down his blood pressure recordings in the morning and in the      evening for 5 days and then to give these to [place] so that we may      further titrate medicines if necessary.  He has a followup      Nephrology in December.  It is quite reasonable that the patient      may need to have his Lasix dose increased as his left lower      extremity edema is slightly worsened.  Lasix may improve his blood      pressure control.  3. Hyperlipidemia.  The patient is on Crestor 10 mg nightly.  We will      check a fasting lipid profile today to be sure his LDL is near his      goal of 70.  4. History of possible cerebrovascular accident.  The patient states      that he had an MRI scan of his brain, at which point his      neurologist changed his aspirin to Plavix.  He is to continue on      Plavix.   We will see the patient back in 3 months' time unless a problem were to  arise in the interim.     Brayton El, MD   Electronically Signed    SGA/MedQ  DD: 06/28/2009  DT: 06/29/2009  Job #: [id number]

## 2011-01-08 NOTE — Letter (Signed)
May 22, 2009    Shane [name]. Caryn Section, M.D.  [address]  [place], [place] [address]   RE:  [name], [name]  MRN:  [id number]  /  DOB:  1930-09-13   Dear Dr. Caryn Section:   I had the pleasure of seeing your patient, Shane [name], in cardiology  clinic today.  As you know, he is a 77 year old black male with chronic  renal insufficiency and a history of coronary artery disease, status  post CABG in 2002 who has been experiencing increased dyspnea on  exertion over the past several months.  Although he has not had any  chest discomfort, his dyspnea alone certainly could be resulting from  coronary ischemia.  Therefore, we have ordered a dobutamine stress  echocardiogram.  We have also ordered at your request a renal artery  duplex study to clarify the issue of whether or not he does indeed have  renal artery stenosis.  Thank you for the referral of this patient and I  look forward to following him along with you.  Please feel free to  contact my office at any time with any questions or concerns.    Sincerely,      Brayton El, MD  Electronically Signed    SGA/MedQ  DD: 05/22/2009  DT: 05/22/2009  Job #: [id number]

## 2011-01-11 NOTE — Cardiovascular Report (Signed)
[facility]  Patient:    Shane [name], Shane [name]                          MRN: [id number] Proc. Date: 09/11/00 Adm. Date:  [id number] Attending:  Alric Quan CC:         Dr. Donavan Burnet, M.D. [facility]  Dr. Sharyne Peach  Cardiac Catheterization Lab   Cardiac Catheterization  PROCEDURE:  Left heart catheterization with coronary angiography.  CARDIOLOGIST:  Daisey Must, M.D. [facility]  INDICATION:  Mr. Usery is a 77 year old male with diabetes and chronic renal insufficiency.  He was admitted to [facility] with chest pain and ruled in for a non-Q-wave myocardial infarction.  After prehydration and treatment with acetylcysteine, he was referred for cardiac catheterization.  CATHETERIZATION PROCEDURAL NOTE:  A 6-French sheath was placed in the right femoral artery.  Standard Judkins 6-French catheters were utilized.  Contrast was Omnipaque.  Left ventriculography was not performed due to renal insufficiency.  There were no complications.  CATHETERIZATION RESULTS:  HEMODYNAMICS:  Left ventricular pressure 138/12, aortic pressure 150/88. There was no aortic valve gradient.  LEFT VENTRICULOGRAM:  Not performed due to renal insufficiency.  CORONARY ANGIOGRAPHY: (Right dominant).  Left main has a 20% stenosis.  Left anterior descending artery has a 30% stenosis in the proximal vessel followed by tubular 90% stenosis in the proximal to mid vessel extending across the origin of a normal size first diagonal branch.  The first diagonal itself has a 95% stenosis at its origin.  The mid LAD has a 20% stenosis.  The LAD gives rise to a normal size first diagonal and small second and third diagonal branches.  Left circumflex has a 30% stenosis in the proximal vessel.  There is a large ramus intermedius branch which has a 90% stenosis proximally.  The circumflex then gives rise to small OM-1, small OM-2, and a normal size OM-3.  Right  coronary artery is diffusely diseased with a 60% stenosis at its origin, 90% stenosis in the proximal vessel, 70% in the mid vessel, and 50% in the distal vessel.  The distal right coronary artery gives rise to a small posterior descending artery and a small posterolateral branch.  IMPRESSIONS: 1. Normal left heart filling pressures. 2. Significant three-vessel coronary artery disease  PLAN: Cardiovascular surgery will be consulted for coronary artery bypass grafting. DD:  09/11/00 TD:  09/12/00 Job: [id number]

## 2011-01-11 NOTE — Discharge Summary (Signed)
[place]. [facility]  Patient:    Shane [name], Shane [name]                          MRN: [id number] Adm. Date:  [id number] Disc. Date: [id number] Attending:  Mikey Bussing Dictator:   Venora Maples, CRNFA CC:         Melany Guernsey, M.D.             Heide Guile, M.D., Rosalita Levan, N.C.             Daisey Must, M.D. [facility]                           Discharge Summary  DATE OF BIRTH:  Oct 13, 1930  ADMITTING DIAGNOSIS:  Anterior wall myocardial infarction.  PAST MEDICAL HISTORY: 1. Hypertension. 2. Chronic renal insufficiency. 3. Non-insulin-dependent diabetes mellitus. 4. Chronic ankle swelling, left greater than right. 5. History of bone injury of left ankle.  ALLERGIES:  This patient has no known drug allergies.  DISCHARGE DIAGNOSES: 1. Severe three vessel coronary artery disease status post coronary artery    bypass grafting. 2. Elevated creatinine postoperatively, resolving. 3. Postoperative ileus, resolved.  HOSPITAL COURSE AND PROCEDURES:  The patient is a 77 year old African-American male who was transferred to [facility] from [facility] where he had been admitted with a two to three day history of new onset of progressive chest pain.  On January 16, he underwent cardiac catheterization with Dr. Loraine Leriche Pulsipher which revealed three vessel coronary artery disease.  Cardiac surgery consult was obtained with Dr. Kathlee Nations Trigt who recommended coronary artery bypass grafting as a preferred treatment choice for this man.  He also recommended a 2D echocardiogram prior to surgery to assess his ventricular function.  On January 17, Shane [name] underwent 2D echocardiogram which revealed preserved preserved left ventricular function. On January 18, he had preoperative Doppler studies which revealed no significant carotid artery disease, he was noted to have bilateral palpable pedal pulses.  On January 19, he underwent a  MUGA scan which revealed normal LV function and ejection fraction of 60%.  On September 15, 2000, Shane [name] underwent uncomplicated coronary artery bypass grafting x 4 with Dr. Kathlee Nations Trigt.  Grafts placed at the time of the procedure were the left internal mammary artery to the LAD, saphenous vein was grafted to the right coronary artery, saphenous vein was grafted to the diagonal, saphenous vein was grafted to the RI.  At the conclusion of the procedure, he was transferred in stable condition to the SICU.  Shane [name] postoperative course has been notable for elevated creatinine and postoperative ileus.  His creatinine on admission was 2.2, it was elevated as high as 2.8 postoperatively. Last measured on January 27, it was 2.5.  Creatinine level will measured again tomorrow, January 28. His postoperative ileus has completely resolved.  He is eating a regular diet and his bowels are moving normally.  Shane [name] has made good progress since his surgery and it is anticipated he can be discharged home tomorrow, January 28 if his creatinine level is acceptable.  CONDITION AND INSTRUCTIONS ON DISCHARGE:  His condition is improved.  He has been instructed regarding diet, medications, activity, wound care, and followup appointments.  MEDICATIONS ON DISCHARGE: 1. Enteric coated aspirin 325 mg p.o. q.d. 2. Darvocet-N 100 mg 1-2 p.o. q.4-6h. p.r.n. 3. Toprol XL 25 mg  p.o. q.d. 4. Lasix 40 mg p.o. q.d. 5. Potassium chloride 20 mEq p.o. q.d.  FOLLOWUP:  Shane [name] will have a staple removal appointment at CVTS office on Friday, February 1.  The office will call with an exact time for that appointment.  He also has an appointment to see his cardiologist, Dr. Sylvie Farrier in [place] on February 8 at 2:30 and he should have a chest x-ray taken at that time.  He will also have an appointment to see Dr. Donata Clay in approximately three weeks.  The [facility] office will be calling with an exact date and time  for that appointment. DD:  09/21/00 TD:  09/22/00 Job: [id number]

## 2011-01-11 NOTE — Consult Note (Signed)
[place]. [facility]  Patient:    Shane [name], Shane [name]                          MRN: [id number] Proc. Date: 09/11/00 Adm. Date:  [id number] Attending:  Alric Quan CC:         CVTS OFFICE  Shiv K. Harsh, M.D. - Rosalita Levan  [facility] - [facility]   Consultation Report  REASON FOR CONSULTATION:  Severe 3 vessel coronary artery disease with recent non Q wave MI.  CHIEF COMPLAINT:  Chest pain.  REFERRING PHYSICIAN:  Dr. Loraine Leriche Pulsipher.  PRIMARY CARE PHYSICIAN: Dr. Steva Ready.  HISTORY OF PRESENT ILLNESS:  The patient is a 77 year old black male who was admitted to [facility] on September 09, 2000, with 2 to 3 day history of progressive chest pain.  This was new onset and was associated with activities such as singing and walking.  He was evaluated in the emergency department at [facility] where his EKG showed anterolateral ST segment changes and his cardiac enzymes were mildly elevated with a troponin of 4.0, and a CPK-MB of 5.0.  He was placed on lovenox, aspirin and nitroglycerin and was transferred to [facility] for further cardiac evaluation.  It was noted on his admission laboratory studies that his creatinine was 2.2, and his white blood cell count was 3000 and his hematocrit was 35%.  The patient was transferred yesterday to [facility] and underwent cardiac catheterization today which consisted of coronary arteriography without ventriculogram.  His coronary arteriograms demonstrated severe 3 vessel coronary disease with a 95% stenosis of the LAD diagonal, 90% stenosis of the ramus intermediate and 90% stenosis of the right coronary artery.  A chest wall echocardiogram done previously at [facility] by report showed left ventricular hypertrophy with normal systolic left ventricular function.  His left ventricular end diastolic pressure at catheterization today was 7 mmHg.  Due to the patients severe 3 vessel coronary  disease he was referred for surgical revascularization and I was asked to see this patient in consultation by Dr. Loraine Leriche Pulsipher.  PAST MEDICAL HISTORY: 1. Hypertension. 2. Chronic renal insufficiency. 3. Diabetes mellitus type 2. 4. Chronic swelling of the ankles, left greater than right. 5. History of a bone injury to the left ankle without fracture.  MEDICATIONS ON ADMISSION: 1. Cardura 8 mg p.o. q.h.s. 2. Glucotrol 10 mg XL 1 q. day. 3. Norvasc 10 mg p.o. q. day. 4. Lasix 40 mg p.o. q. day. 5. Potassium 20 mEq p.o. t.i.d.  ALLERGIES:  The patient denies any medication allergies.  SOCIAL HISTORY:  The patient is married and lives with his wife.  He still works part time at the [employer] in [place].  He denies smoking for several years and denies alcohol use for several years.  FAMILY HISTORY:  The patients father died at age 58, probably due to a myocardial infarction and the mother died in her 63s of unknown cause.  He has 8 siblings, and 3 are deceased due to accidental cause.  There is a positive family history of diabetes and hypertension.  REVIEW OF SYSTEMS:  The patient states he had an episode of syncope 7 or 8 years ago and is evaluated at [facility] where his head CT scan was negative.  He was found to have significant hypokalemia and has been taking potassium ever since.  The patient states he is right-handed.  He denies other symptoms of TIA or  CVA.  He denies any symptoms of claudication or DVT.  He denies any problems with free bleeding or a bleeding diaphysis or easy bruisability.  He denies any change in weight, bowel habits, or blood per rectum.  PHYSICAL EXAMINATION:  He is 5 foot 7 inches tall, and weighs 193 pounds.  VITAL SIGNS:  Blood pressure is 150/80, heart rate is 64 per minute and regular in sinus rhythm.  GENERAL:  That of a pleasant well-nourished black male in the CCU following cardiac catheterization without chest pain and no  distress.  HEENT:  Normocephalic.  He is edentulous and the pharynx is clear.  EOMs are normal.  NECK:  Supple without JVD, thyromegaly, mass or carotid bruit.  LYMPH NODES:  REveals no adenopathy palpable in the supraclavicular, cervical or axillary regions.  LUNGS:  Clear to auscultation.  CHEST:  Without deformity.  CARDIAC:  Reveals regular rate and rhythm without S3 gallop or a murmur.  ABDOMEN:  Soft, nontender without organomegaly, mass or abdominal bruits.  He has a compression dressing in the right groin.  Pedal pulses are 1 to 2+ bilaterally and there is a scar over the left ankle anteriorly with some mild left ankle edema.  There is no evidence of chronic venous insufficiency of the right leg.  SKIN:  Reveals no lesions or rash.  NEUROLOGIC: Reveals him to be alert, oriented x 3 with full motor function.  RECTAL:  Deferred.  LABORATORY DATA:  I reviewed the coronary arteriograms and reviewed the findings with Dr. Gerri Spore in that there is severe 3 vessel coronary disease with 90% stenosis of the LAD diagonal, 90% stenosis of the ramus intermediate, 90% stenosis of the right coronary artery.  I agree with the recommendation for surgical revascularization to provide him with benefits with respect to improved symptoms, preservation of ventricular function and improved survival. Due to the patients elevated baseline creatinine and delay before surgery would be best and we will schedule his operation for Monday, September 15, 2000. Prior to surgery a 2-D echocardiogram to assess his ventricular function would be needed since a ventriculogram was not performed.  I discussed the indications and benefits for coronary artery bypass surgery with the patient, his wife and his daughter.  I reviewed the major aspects of  the operation including the choice of conduit, the location of the surgical incisions, the use of general anesthesia and cardiopulmonary bypass, and  the expected hospital recovery period.  I reviewed the risks that are associated with this operation to the patient including those risks of myocardial infarction, cerebrovascular accident, bleeding, infection, and death.  I discussed the alternatives of surgical therapy for his coronary artery disease.  He understands these implications and agrees to proceed with the operation as planned under informed consent. DD:  09/11/00 TD:  09/11/00 Job: [id number]

## 2011-01-11 NOTE — Op Note (Signed)
[place]. [facility]  Patient:    Shane [name], Shane [name]                          MRN: [id number] Proc. Date: 09/15/00 Adm. Date:  [id number] Attending:  Mikey Bussing CC:         Daisey Must, M.D. [facility]  Cardiac Catheterization Lab   Operative Report  OPERATION PERFORMED:  Coronary artery bypass grafting x 42 (left internal mammary artery to left anterior descending, saphenous vein graft to the first diagonal, saphenous vein graft to ramus intermediate, saphenous vein graft to right coronary artery).  PREOPERATIVE DIAGNOSIS:  Severe three vessel coronary artery disease with recent subendocardial myocardial infarction and left main stenosis.  POSTOPERATIVE DIAGNOSIS:  Severe three vessel coronary artery disease with recent subendocardial myocardial infarction and left main stenosis.  SURGEON:  Mikey Bussing, M.D.  ASSISTANT:  Lissa Merlin, P.A.-C.  ANESTHESIA:  General.  INDICATIONS:  The patient is a 77 year old black male diabetic who presents for the new onset of chest pain and ruled in for myocardial infarction with enzymes.  Cardiac catheterization performed by Dr. Gerri Spore demonstrated severe three vessel coronary disease with a 60-70% left main stenosis.  His creatinine was elevated at 2.2 and a ventriculogram was not performed.  A 2-D echocardiogram was performed that showed preserved systolic left ventricular function.  His creatinine rose slightly following catheterization but then returned to baseline and he underwent surgical coronary revascularization. Prior to the operation, I examined the patient in his CCU room and reviewed the results of the cardiac catheterization with the patient and family.  I discussed the catheterization and the expected benefits of coronary bypass surgery.  I discussed the alternatives to surgical therapy and their expected outcome.  I reviewed the major aspects of this operation including the location of  the surgical incisions, the choice of conduit, the use of general anesthesia and cardiopulmonary bypass and the expected postoperative recovery. I discussed the impact of his baseline renal insufficiency and the impact that it would have on his postoperative recovery.  I reviewed the risks associated with this operation including the risks of MI, CVA, bleeding, infection and death.  We reviewed the probability of a transfusion requirement due to his anemia going into the operation.  The patient understood these implications for surgery and agreed to proceed with the operation as planned under informed consent.  OPERATIVE FINDINGS:  The saphenous vein was harvested from the right leg as the left leg had sustained a previous injury.  The vein was of small caliber but of adequate quality.  The mammary artery was a good conduit with excellent flow.  There was no evidence of myocardial fibrosis or scarring.  The right coronary artery and diagonal were small vessels.  The LAD had diffuse plaquing but no hemodynamically significant stenoses distal to the anastomosis.  DESCRIPTION OF PROCEDURE:  The patient was brought to the operating room and placed supine on the operating table where general anesthesia was induced under invasive hemodynamic monitoring.  The chest, abdomen, and legs were prepped with Betadine and draped as a sterile field.  A median sternotomy was performed as the saphenous vein was harvested from the right leg.  The internal mammary artery was harvested as a pedicle graft from its origin at the subclavian vessels.  Heparin was administered, and the ACT was documented as being therapeutic.  Two pursestrings were placed in the ascending aorta and right atrium.  The patient was cannulated and placed on bypass and cooled to 32 degrees.  The coronaries were identified.  The mammary arteries and vein grafts were prepared for the distal anastomoses.  A cardioplegia cannula  was placed, and the patient was cooled to 28 degrees.  As the aortic cross-clamp was applied, 500 cc of cold blood cardioplegia was delivered to the aortic root with immediate cardioplegic arrest and septal temperature dropping less than 12 degrees.  Topical ice saline slush was used to augment myocardial preservation and a pericardial insulator pad was used to protect the left phrenic nerve.  The distal coronary anastomoses were then performed.  The first distal anastomosis was the distal RCA.  This was a 1.5 mm vessel with proximal 90% stenosis and the reverse saphenous vein was sewn end-to-side with running 7-0 Prolene.  A second distal anastomosis was the first diagonal which was a small 1.3 mm vessel with proximal 90% stenosis.  The reverse saphenous vein was sewn end-to-side with a running 7-0 Prolene with a good flow through the graft. The third distal anastomosis was the intramyocardial ramus intermediate which is a larger 1.8 to 2.0 mm vessel with proximal 90% stenosis.  A reverse saphenous vein was sewn end-to-side with running 7-0 Prolene with excellent flow through the graft.  Cardioplegia was redosed through the vein grafts.  The fourth distal anastomosis was to the distal third of the LAD which is a 2.0 mm vessel where it became epicardial in its location.  The left internal mammary artery was brought through an opening, graded in the left lateral pericardium.  It was brought down on the LAD and sewn end-to-side with a running 8-0 Prolene.  There was excellent flow through the anastomosis with immediate rise in septal temperature after release of the pedicle clamp on the mammary artery.  The mammary pedicle was secured to the epicardium, and the aortic cross-clamp was removed.  He received a spontaneous rhythm.  A partial occlusion clamp was placed on the ascending aorta and three proximal vein graft anastomosis were placed using a 4.0 mm punch and running 6-0 Prolene.   The partial clamp was removed and the  vein grafts were perfused.  Each had adequate flow and hemostasis was documented to the proximal and distal sites.  Temporary pacing wires were applied, and the patient was rewarmed to 37 degrees.  The lungs were reexpanded and the ventilator was resumed.  The patient was then weaned from cardiopulmonary bypass on renal dose dopamine and excellent blood pressure and cardiac output.  Protamine was administered and the cannulas were removed. The mediastinum was irrigated with warm antibiotic irrigation.  The leg incision was irrigated and closed in a standard fashion.  The pericardium was loosely reapproximated.  Two mediastinal and a left pleural chest tube were placed and brought out through separate incisions.  The sternum was reapproximated with eight interrupted steel wires.  The pectoralis fascia was closed with a running 0 Vicryl, and the subcutaneous layer was closed with a running 2-0 Vicryl.  The skin was closed with a running subcuticular and sterile dressings were applied.  Total cardiopulmonary bypass time was 145 minutes with aortic cross-clamp time of 65 minutes. DD:  09/15/00 TD:  09/15/00 Job: [id number]

## 2011-01-17 ENCOUNTER — Other Ambulatory Visit: Payer: Self-pay | Admitting: *Deleted

## 2011-01-17 MED ORDER — HYDRALAZINE HCL 25 MG PO TABS: 25.0000 mg | ORAL_TABLET | Freq: Two times a day (BID) | ORAL | Status: DC

## 2011-02-19 ENCOUNTER — Encounter: Payer: Self-pay | Admitting: Cardiovascular Disease

## 2011-02-20 ENCOUNTER — Telehealth: Payer: Self-pay | Admitting: Cardiovascular Disease

## 2011-02-20 MED ORDER — ATORVASTATIN CALCIUM 40 MG PO TABS: 40.0000 mg | ORAL_TABLET | Freq: Every day | ORAL | Status: DC

## 2011-02-20 NOTE — Telephone Encounter (Signed)
Pt is out of lipitor and needs refill called in to [facility] asap

## 2011-03-14 ENCOUNTER — Ambulatory Visit (INDEPENDENT_AMBULATORY_CARE_PROVIDER_SITE_OTHER): Payer: Medicare Other | Admitting: Cardiovascular Disease

## 2011-03-14 ENCOUNTER — Encounter: Payer: Self-pay | Admitting: Cardiovascular Disease

## 2011-03-14 DIAGNOSIS — I2510 Atherosclerotic heart disease of native coronary artery without angina pectoris: Secondary | ICD-10-CM

## 2011-03-14 DIAGNOSIS — I10 Essential (primary) hypertension: Secondary | ICD-10-CM

## 2011-03-14 NOTE — Patient Instructions (Signed)
Your physician recommends that you schedule a follow-up appointment in: 6 months  

## 2011-03-14 NOTE — Progress Notes (Signed)
HPI  This is a 77 year old gentleman who is here today for a followup visit. He has history of coronary artery disease status post coronary artery bypass graft surgery in 2002. No cardiac events or intervention since then. I saw him back in February for preoperative cardiovascular evaluation for kidney transplant. He underwent an echocardiogram which showed normal LV systolic function with mild left ventricular hypertrophy. He underwent a nuclear stress test which showed a small apical defect representing minor ischemia versus apical thinning. It was felt to be an overall low risk study. The patient was considered overall low risk for kidney transplant. He is now on the transplant list. He denies any chest pain. His dyspnea is stable. His lower extremity edema is unchanged.  No Known Allergies   Current Outpatient Prescriptions on File Prior to Visit  Medication Sig Dispense Refill  . atorvastatin (LIPITOR) 40 MG tablet Take 1 tablet (40 mg total) by mouth daily.  30 tablet  1  . hydrALAZINE (APRESOLINE) 25 MG tablet Take 1 tablet (25 mg total) by mouth 2 (two) times daily.  60 tablet  3     Past Medical History  Diagnosis Date  . Coronary artery disease   . Chronic kidney disease     on kidney transplant list  . Hypertension   . CVA (cerebral vascular accident)   . Diabetes mellitus      Past Surgical History  Procedure Date  . Dialysis fistula creation   . Coronary artery bypass graft 2002     Family History  Problem Relation Age of Onset  . Diabetes    . Kidney disease    . Heart disease    . Stroke       History   Social History  . Marital Status: Married    Spouse Name: N/A    Number of Children: 5  . Years of Education: N/A   Occupational History  . retired    Social History Main Topics  . Smoking status: Former Games developer  . Smokeless tobacco: Not on file  . Alcohol Use: No  . Drug Use: No  . Sexually Active: Not on file   Other Topics Concern  . Not on  file   Social History Narrative  . No narrative on file      PHYSICAL EXAM   BP 118/60  Pulse 58  Ht 5\' 6"  (1.676 m)  Wt 192 lb (87.091 kg)  BMI 30.99 kg/m2  SpO2 96%  Constitutional: He is oriented to person, place, and time. He appears well-developed and well-nourished. No distress.  HENT: No nasal discharge.  Head: Normocephalic and atraumatic.  Eyes: Pupils are equal, round, and reactive to light. Right eye exhibits no discharge. Left eye exhibits no discharge.  Neck: Normal range of motion. Neck supple. No JVD present. No thyromegaly present.  Cardiovascular: Normal rate, regular rhythm, normal heart sounds and intact distal pulses. Exam reveals no gallop and no friction rub.  No murmur heard.  Pulmonary/Chest: Effort normal and breath sounds normal. No stridor. No respiratory distress. He has no wheezes. He has no rales. He exhibits no tenderness.  Abdominal: Soft. Bowel sounds are normal. He exhibits no distension. There is no tenderness. There is no rebound and no guarding.  Musculoskeletal: Normal range of motion. He exhibits a Swan edema and no tenderness.  Neurological: He is alert and oriented to person, place, and time. Coordination normal.  Skin: Skin is warm and dry. No rash noted. He is not diaphoretic. No  erythema. No pallor.  Psychiatric: He has a normal mood and affect. His behavior is normal. Judgment and thought content normal.        ASSESSMENT AND PLAN

## 2011-03-14 NOTE — Assessment & Plan Note (Signed)
His blood pressure is well controlled. Continue current medications. 

## 2011-03-14 NOTE — Assessment & Plan Note (Signed)
The patient is stable at this time from a cardiac standpoint. He is not having any symptoms of chest pain. His dyspnea is stable. Continue medical therapy.

## 2011-03-25 ENCOUNTER — Encounter: Payer: Self-pay | Admitting: Cardiovascular Disease

## 2011-12-19 ENCOUNTER — Other Ambulatory Visit: Payer: Self-pay

## 2011-12-19 DIAGNOSIS — Z01810 Encounter for preprocedural cardiovascular examination: Secondary | ICD-10-CM

## 2011-12-19 DIAGNOSIS — N184 Chronic kidney disease, stage 4 (severe): Secondary | ICD-10-CM

## 2012-01-07 ENCOUNTER — Encounter: Payer: Self-pay | Admitting: Vascular Surgery

## 2012-01-08 ENCOUNTER — Encounter: Payer: Self-pay | Admitting: Vascular Surgery

## 2012-01-08 ENCOUNTER — Ambulatory Visit (INDEPENDENT_AMBULATORY_CARE_PROVIDER_SITE_OTHER): Payer: Medicare Other | Admitting: Vascular Surgery

## 2012-01-08 ENCOUNTER — Ambulatory Visit (INDEPENDENT_AMBULATORY_CARE_PROVIDER_SITE_OTHER): Payer: Medicare Other | Admitting: *Deleted

## 2012-01-08 VITALS — BP 137/65 | HR 82 | Resp 18 | Ht 66.0 in | Wt 191.0 lb

## 2012-01-08 DIAGNOSIS — T82898A Other specified complication of vascular prosthetic devices, implants and grafts, initial encounter: Secondary | ICD-10-CM

## 2012-01-08 DIAGNOSIS — N186 End stage renal disease: Secondary | ICD-10-CM | POA: Insufficient documentation

## 2012-01-08 DIAGNOSIS — N184 Chronic kidney disease, stage 4 (severe): Secondary | ICD-10-CM

## 2012-01-08 DIAGNOSIS — Z01810 Encounter for preprocedural cardiovascular examination: Secondary | ICD-10-CM

## 2012-01-08 NOTE — Progress Notes (Signed)
[facility] of [place]  Patient name: Shane [name] MRN: [id number] DOB: 05/25/1931 Sex: male  REASON FOR VISIT: poorly maturing left radiocephalic AV fistula  HPI: Shane [name] is a 78 y.o. male who had a left radiocephalic AV fistula placed approximately 2 years ago. He is not yet on dialysis. Given that the fistula was not maturing we were asked to evaluate him for new access. Of note he has had no recent uremic symptoms. Specifically he denies nausea, vomiting, fatigue, anorexia, or palpitations.   REVIEW OF SYSTEMS: [X ] denotes positive finding; [  ] denotes negative finding  CARDIOVASCULAR:  [ ] chest pain   [ ] dyspnea on exertion    CONSTITUTIONAL:  [ ] fever   [ ] chills  PHYSICAL EXAM: Filed Vitals:   01/08/12 1109  BP: 137/65  Pulse: 82  Resp: 18  Height: 5' 6" (1.676 m)  Weight: 191 lb (86.637 kg)   Body mass index is 30.83 kg/(m^2). GENERAL: The patient is a well-nourished male, in no acute distress. The vital signs are documented above. CARDIOVASCULAR: There is a regular rate and rhythm  PULMONARY: There is good air exchange bilaterally without wheezing or rales. He has a weak thrill in his left forearm AV fistula.  I have independently interpreted his duplex of his fistula on the left which shows a long area of narrowing in the proximal fistula with diffuse intimal hyperplasia. The vein is very small throughout with diameters ranging from 0.142 0.3 cm. The upper arm cephalic vein likewise is small. I didn't irrigate with the duplex scanner myself in the basilic vein on the right also looks marginal. I looked in the right arm myself with the duplex scanner in the veins there did not look much bigger.  MEDICAL ISSUES: I have recommended that we explore his basilic vein in the left arm and consider a basilic vein transposition on the left. If this is not adequate then he would require a left arm AV graft. We would also need to ligate his fistula on the  left at the same time. I have explained the indications for placement of an AV fistula or AV graft. I've explained that if at all possible we will place an AV fistula.  I have reviewed the risks of placement of an AV fistula including but not limited to: failure of the fistula to mature, need for subsequent interventions, and thrombosis. In addition I have reviewed the potential complications of placement of an AV graft. These risks include, but are not limited to, graft thrombosis, graft infection, wound healing problems, bleeding, arm swelling, and steal syndrome. All the patient's questions were answered and they are agreeable to proceed with surgery. His surgery is scheduled for 01/16/2012.  Sabree Nuon S [facility] of  Beeper: [phone]     

## 2012-01-10 ENCOUNTER — Encounter (HOSPITAL_COMMUNITY): Payer: Self-pay | Admitting: Pharmacy Technician

## 2012-01-13 ENCOUNTER — Other Ambulatory Visit: Payer: Self-pay

## 2012-01-14 ENCOUNTER — Inpatient Hospital Stay (HOSPITAL_COMMUNITY): Admission: RE | Admit: 2012-01-14 | Discharge: 2012-01-14 | Payer: Medicare Other | Source: Ambulatory Visit

## 2012-01-14 ENCOUNTER — Encounter (HOSPITAL_COMMUNITY): Payer: Self-pay

## 2012-01-14 HISTORY — DX: Acute myocardial infarction, unspecified: I21.9

## 2012-01-14 HISTORY — DX: Unspecified osteoarthritis, unspecified site: M19.90

## 2012-01-14 HISTORY — DX: Personal history of other diseases of the digestive system: Z87.19

## 2012-01-14 HISTORY — DX: Gastro-esophageal reflux disease without esophagitis: K21.9

## 2012-01-14 HISTORY — DX: Depression, unspecified: F32.A

## 2012-01-14 HISTORY — DX: Shortness of breath: R06.02

## 2012-01-14 HISTORY — DX: Major depressive disorder, single episode, unspecified: F32.9

## 2012-01-14 NOTE — Progress Notes (Addendum)
Note given to [facility] to request EKG, CXR patient states was done at [facility] because of being on transplant list.  Spoke with Kennith Center at [facility] requested most recent OV note, EKG and any tests.

## 2012-01-14 NOTE — Progress Notes (Signed)
Ok per judy at [facility] patient does not need to stop Plavix, just don't have him take DOS.

## 2012-01-14 NOTE — Pre-Procedure Instructions (Deleted)
20 RANBIR CHEW  01/14/2012   Your procedure is scheduled on:  Thursday May 23  Report to [facility] at 7:30 AM.  Call this number if you have problems the morning of surgery: [phone]   Remember:   Do not eat food:After Midnight.  May have clear liquids: up to 4 Hours before arrival.  Clear liquids include soda, tea, black coffee, apple or grape juice, broth.  Take these medicines the morning of surgery with A SIP OF WATER: Hydralazine, Allopurinol, Carvedilol, Proscar, Procardia, Zantac   Do not wear jewelry, make-up or nail polish.  Do not wear lotions, powders, or perfumes. You may wear deodorant.  Do not shave 48 hours prior to surgery. Men may shave face and neck.  Do not bring valuables to the hospital.  Contacts, dentures or bridgework may not be worn into surgery.  Leave suitcase in the car. After surgery it may be brought to your room.  For patients admitted to the hospital, checkout time is 11:00 AM the day of discharge.   Patients discharged the day of surgery will not be allowed to drive home.  Name and phone number of your driver: Fulton Mole [phone]   Special Instructions: CHG Shower Use Special Wash: 1/2 bottle night before surgery and 1/2 bottle morning of surgery.   Please read over the following fact sheets that you were given: Pain Booklet, Coughing and Deep Breathing and Surgical Site Infection Prevention

## 2012-01-14 NOTE — Pre-Procedure Instructions (Signed)
20 SHOLOM DULUDE  01/14/2012   Your procedure is scheduled on:  01/16/12  Report to [facility] at 7:30 AM.  Call this number if you have problems the morning of surgery: [phone]   Remember:   Do not eat food:After Midnight.  May have clear liquids: up to 4 Hours before arrival 3:30 AM).  Clear liquids include soda, tea, black coffee, apple or grape juice, broth.  Take these medicines the morning of surgery with A SIP OF WATER: Zyloprim, Coreg **, Celexa, Proscar, Namenda, Nifedipine/Procardia, Zantac   Do not wear jewelry, make-up or nail polish.  Do not wear lotions, powders, or perfumes. You may wear deodorant.  Do not shave 48 hours prior to surgery. Men may shave face and neck.  Do not bring valuables to the hospital.  Contacts, dentures or bridgework may not be worn into surgery.  Leave suitcase in the car. After surgery it may be brought to your room.  For patients admitted to the hospital, checkout time is 11:00 AM the day of discharge.   Patients discharged the day of surgery will not be allowed to drive home.  Name and phone number of your driver: spouse   Special Instructions: CHG Shower Use Special Wash: 1/2 bottle night before surgery and 1/2 bottle morning of surgery.   Please read over the following fact sheets that you were given: Pain Booklet, Coughing and Deep Breathing, MRSA Information and Surgical Site Infection Prevention

## 2012-01-15 ENCOUNTER — Other Ambulatory Visit: Payer: Self-pay

## 2012-01-15 ENCOUNTER — Encounter (HOSPITAL_COMMUNITY)
Admission: RE | Admit: 2012-01-15 | Discharge: 2012-01-15 | Disposition: A | Discharge status: Home / Self Care | Payer: Medicare Other | Source: Ambulatory Visit | Attending: Vascular Surgery | Admitting: Vascular Surgery

## 2012-01-15 MED ORDER — CEFAZOLIN SODIUM 1-5 GM-% IV SOLN: 1.0000 g | Freq: Once | INTRAVENOUS | Status: DC

## 2012-01-15 MED ORDER — SODIUM CHLORIDE 0.9 % IV SOLN: INTRAVENOUS | Status: DC

## 2012-01-15 MED ORDER — CEFAZOLIN SODIUM-DEXTROSE 2-3 GM-% IV SOLR
2.0000 g | Freq: Once | INTRAVENOUS | Status: AC
  Administered 2012-01-16: 2 g via INTRAVENOUS
  Filled 2012-01-15: qty 50

## 2012-01-15 NOTE — Pre-Procedure Instructions (Addendum)
20 Shane [name]  01/15/2012   Your procedure is scheduled on:  01/16/12  Report to [facility] at 7:30 AM.  Call this number if you have problems the morning of surgery: [phone]   Remember:   Do not eat food:After Midnight.  May have clear liquids: up to 4 Hours before arrival (3:30 AM).  Clear liquids include soda, tea, black coffee, apple or grape juice, broth.  Take these medicines the morning of surgery with A SIP OF WATER: Allopurinol, Nifedipine, Carvedilol, Atorvastatin, Finasteride   Do not wear jewelry, make-up or nail polish.  Do not wear lotions, powders, or perfumes. You may wear deodorant.  Do not shave 48 hours prior to surgery. Men may shave face and neck.  Do not bring valuables to the hospital.  Contacts, dentures or bridgework may not be worn into surgery.  Leave suitcase in the car. After surgery it may be brought to your room.  For patients admitted to the hospital, checkout time is 11:00 AM the day of discharge.   Patients discharged the day of surgery will not be allowed to drive home.  Name and phone number of your driver: spouse   Special Instructions: CHG Shower Use Special Wash: 1/2 bottle night before surgery and 1/2 bottle morning of surgery.   Please read over the following fact sheets that you were given: Pain Booklet, Coughing and Deep Breathing, MRSA Information and Surgical Site Infection Prevention   Testing done today: MRSA/Staph screen

## 2012-01-16 ENCOUNTER — Telehealth: Payer: Self-pay | Admitting: Vascular Surgery

## 2012-01-16 ENCOUNTER — Encounter (HOSPITAL_COMMUNITY)
Admission: RE | Disposition: A | Discharge status: Home / Self Care | Payer: Self-pay | Source: Ambulatory Visit | Attending: Vascular Surgery

## 2012-01-16 ENCOUNTER — Other Ambulatory Visit: Payer: Self-pay | Admitting: *Deleted

## 2012-01-16 ENCOUNTER — Encounter (HOSPITAL_COMMUNITY): Payer: Self-pay | Admitting: Anesthesiology

## 2012-01-16 ENCOUNTER — Ambulatory Visit (HOSPITAL_COMMUNITY)
Admission: RE | Admit: 2012-01-16 | Discharge: 2012-01-16 | Disposition: A | Discharge status: Home / Self Care | Payer: Medicare Other | Source: Ambulatory Visit | Attending: Vascular Surgery | Admitting: Vascular Surgery

## 2012-01-16 ENCOUNTER — Ambulatory Visit (HOSPITAL_COMMUNITY): Payer: Medicare Other | Admitting: Anesthesiology

## 2012-01-16 DIAGNOSIS — N186 End stage renal disease: Secondary | ICD-10-CM

## 2012-01-16 DIAGNOSIS — K219 Gastro-esophageal reflux disease without esophagitis: Secondary | ICD-10-CM | POA: Insufficient documentation

## 2012-01-16 DIAGNOSIS — Z01812 Encounter for preprocedural laboratory examination: Secondary | ICD-10-CM | POA: Insufficient documentation

## 2012-01-16 DIAGNOSIS — E119 Type 2 diabetes mellitus without complications: Secondary | ICD-10-CM | POA: Insufficient documentation

## 2012-01-16 DIAGNOSIS — I252 Old myocardial infarction: Secondary | ICD-10-CM | POA: Insufficient documentation

## 2012-01-16 DIAGNOSIS — I2510 Atherosclerotic heart disease of native coronary artery without angina pectoris: Secondary | ICD-10-CM | POA: Insufficient documentation

## 2012-01-16 DIAGNOSIS — I120 Hypertensive chronic kidney disease with stage 5 chronic kidney disease or end stage renal disease: Secondary | ICD-10-CM | POA: Insufficient documentation

## 2012-01-16 HISTORY — PX: AV FISTULA PLACEMENT: SHX1204

## 2012-01-16 LAB — POCT I-STAT 4, (NA,K, GLUC, HGB,HCT)
Glucose, Bld: 129 mg/dL — ABNORMAL HIGH (ref 70–99)
HCT: 29 % — ABNORMAL LOW (ref 39.0–52.0)
Hemoglobin: 9.9 g/dL — ABNORMAL LOW (ref 13.0–17.0)

## 2012-01-16 SURGERY — TRANSPOSITION, VEIN, BASILIC
Anesthesia: General | Site: Arm Upper | Laterality: Left | Wound class: Clean

## 2012-01-16 MED ORDER — FENTANYL CITRATE 0.05 MG/ML IJ SOLN: 25.0000 ug | INTRAMUSCULAR | Status: DC | PRN

## 2012-01-16 MED ORDER — EPHEDRINE SULFATE 50 MG/ML IJ SOLN
INTRAMUSCULAR | Status: DC | PRN
  Administered 2012-01-16: 5 mg via INTRAVENOUS
  Administered 2012-01-16 (×3): 10 mg via INTRAVENOUS
  Administered 2012-01-16: 5 mg via INTRAVENOUS
  Administered 2012-01-16: 10 mg via INTRAVENOUS

## 2012-01-16 MED ORDER — LIDOCAINE HCL (CARDIAC) 20 MG/ML IV SOLN
INTRAVENOUS | Status: DC | PRN
  Administered 2012-01-16: 80 mg via INTRAVENOUS

## 2012-01-16 MED ORDER — PROTAMINE SULFATE 10 MG/ML IV SOLN
INTRAVENOUS | Status: DC | PRN
  Administered 2012-01-16 (×2): 5 mg via INTRAVENOUS
  Administered 2012-01-16 (×3): 10 mg via INTRAVENOUS

## 2012-01-16 MED ORDER — ONDANSETRON HCL 4 MG/2ML IJ SOLN
INTRAMUSCULAR | Status: DC | PRN
  Administered 2012-01-16: 4 mg via INTRAVENOUS

## 2012-01-16 MED ORDER — 0.9 % SODIUM CHLORIDE (POUR BTL) OPTIME
TOPICAL | Status: DC | PRN
  Administered 2012-01-16: 200 mL

## 2012-01-16 MED ORDER — FENTANYL CITRATE 0.05 MG/ML IJ SOLN
INTRAMUSCULAR | Status: DC | PRN
  Administered 2012-01-16: 25 ug via INTRAVENOUS
  Administered 2012-01-16: 100 ug via INTRAVENOUS

## 2012-01-16 MED ORDER — ONDANSETRON HCL 4 MG/2ML IJ SOLN: 4.0000 mg | Freq: Once | INTRAMUSCULAR | Status: DC | PRN

## 2012-01-16 MED ORDER — OXYCODONE HCL 5 MG PO TABS: 5.0000 mg | ORAL_TABLET | Freq: Four times a day (QID) | ORAL | Status: AC | PRN

## 2012-01-16 MED ORDER — SODIUM CHLORIDE 0.9 % IR SOLN
Status: DC | PRN
  Administered 2012-01-16: 08:00:00

## 2012-01-16 MED ORDER — PROPOFOL 10 MG/ML IV EMUL
INTRAVENOUS | Status: DC | PRN
  Administered 2012-01-16: 160 mg via INTRAVENOUS

## 2012-01-16 MED ORDER — MUPIROCIN 2 % EX OINT
TOPICAL_OINTMENT | CUTANEOUS | Status: AC
  Administered 2012-01-16: 1 via NASAL
  Filled 2012-01-16: qty 22

## 2012-01-16 MED ORDER — GLYCOPYRROLATE 0.2 MG/ML IJ SOLN
INTRAMUSCULAR | Status: DC | PRN
  Administered 2012-01-16: 0.2 mg via INTRAVENOUS

## 2012-01-16 MED ORDER — HEPARIN SODIUM (PORCINE) 1000 UNIT/ML IJ SOLN
INTRAMUSCULAR | Status: DC | PRN
  Administered 2012-01-16: 6000 [IU] via INTRAVENOUS

## 2012-01-16 MED ORDER — MEPERIDINE HCL 25 MG/ML IJ SOLN: 6.2500 mg | INTRAMUSCULAR | Status: DC | PRN

## 2012-01-16 MED ORDER — SODIUM CHLORIDE 0.9 % IV SOLN
INTRAVENOUS | Status: DC | PRN
  Administered 2012-01-16: 07:00:00 via INTRAVENOUS

## 2012-01-16 SURGICAL SUPPLY — 37 items
CANISTER SUCTION 2500CC (MISCELLANEOUS) ×2 IMPLANT
CLIP TI MEDIUM 6 (CLIP) ×2 IMPLANT
CLIP TI WIDE RED SMALL 24 (CLIP) ×2 IMPLANT
CLIP TI WIDE RED SMALL 6 (CLIP) ×2 IMPLANT
CLOTH BEACON ORANGE TIMEOUT ST (SAFETY) ×2 IMPLANT
COVER PROBE W GEL 5X96 (DRAPES) ×2 IMPLANT
COVER SURGICAL LIGHT HANDLE (MISCELLANEOUS) ×4 IMPLANT
DECANTER SPIKE VIAL GLASS SM (MISCELLANEOUS) ×2 IMPLANT
DERMABOND ADVANCED (GAUZE/BANDAGES/DRESSINGS) ×2
DERMABOND ADVANCED .7 DNX12 (GAUZE/BANDAGES/DRESSINGS) ×2 IMPLANT
DRAIN PENROSE 1/2X12 LTX STRL (WOUND CARE) IMPLANT
ELECT REM PT RETURN 9FT ADLT (ELECTROSURGICAL) ×2
ELECTRODE REM PT RTRN 9FT ADLT (ELECTROSURGICAL) ×1 IMPLANT
GLOVE BIO SURGEON STRL SZ 6.5 (GLOVE) ×4 IMPLANT
GLOVE BIO SURGEON STRL SZ7.5 (GLOVE) ×2 IMPLANT
GLOVE BIOGEL PI IND STRL 6.5 (GLOVE) ×4 IMPLANT
GLOVE BIOGEL PI IND STRL 7.5 (GLOVE) ×1 IMPLANT
GLOVE BIOGEL PI INDICATOR 6.5 (GLOVE) ×4
GLOVE BIOGEL PI INDICATOR 7.5 (GLOVE) ×1
GLOVE ECLIPSE 6.5 STRL STRAW (GLOVE) ×4 IMPLANT
GLOVE ORTHOPEDIC STR SZ6.5 (GLOVE) ×2 IMPLANT
KIT BASIN OR (CUSTOM PROCEDURE TRAY) ×2 IMPLANT
KIT ROOM TURNOVER OR (KITS) ×2 IMPLANT
NS IRRIG 1000ML POUR BTL (IV SOLUTION) ×2 IMPLANT
PACK CV ACCESS (CUSTOM PROCEDURE TRAY) ×2 IMPLANT
PAD ARMBOARD 7.5X6 YLW CONV (MISCELLANEOUS) ×4 IMPLANT
SPONGE GAUZE 4X4 12PLY (GAUZE/BANDAGES/DRESSINGS) ×2 IMPLANT
SPONGE SURGIFOAM ABS GEL 100 (HEMOSTASIS) IMPLANT
SUT PROLENE 6 0 BV (SUTURE) ×2 IMPLANT
SUT SILK 2 0 FS (SUTURE) ×2 IMPLANT
SUT SILK 3 0 (SUTURE) ×1
SUT SILK 3-0 18XBRD TIE 12 (SUTURE) ×1 IMPLANT
SUT VIC AB 3-0 SH 27 (SUTURE) ×2
SUT VIC AB 3-0 SH 27X BRD (SUTURE) ×2 IMPLANT
SUT VICRYL 4-0 PS2 18IN ABS (SUTURE) ×4 IMPLANT
UNDERPAD 30X30 INCONTINENT (UNDERPADS AND DIAPERS) ×2 IMPLANT
WATER STERILE IRR 1000ML POUR (IV SOLUTION) ×2 IMPLANT

## 2012-01-16 NOTE — H&P (View-Only) (Signed)
Vascular and Vein Specialist of [facility]  Patient name: CAELAN BRANDEN MRN: [id number] DOB: [birth year] Sex: male  REASON FOR VISIT: poorly maturing left radiocephalic AV fistula  HPI: ROMY MCGUE is a 78 y.o. male who had a left radiocephalic AV fistula placed approximately 2 years ago. He is not yet on dialysis. Given that the fistula was not maturing we were asked to evaluate him for new access. Of note he has had no recent uremic symptoms. Specifically he denies nausea, vomiting, fatigue, anorexia, or palpitations.   REVIEW OF SYSTEMS: Arly.Keller ] denotes positive finding; [  ] denotes negative finding  CARDIOVASCULAR:  [ ]  chest pain   [ ]  dyspnea on exertion    CONSTITUTIONAL:  [ ]  fever   [ ]  chills  PHYSICAL EXAM: Filed Vitals:   01/08/12 1109  BP: 137/65  Pulse: 82  Resp: 18  Height: 5\' 6"  (1.676 m)  Weight: 191 lb (86.637 kg)   Body mass index is 30.83 kg/(m^2). GENERAL: The patient is a well-nourished male, in no acute distress. The vital signs are documented above. CARDIOVASCULAR: There is a regular rate and rhythm  PULMONARY: There is good air exchange bilaterally without wheezing or rales. He has a weak thrill in his left forearm AV fistula.  I have independently interpreted his duplex of his fistula on the left which shows a long area of narrowing in the proximal fistula with diffuse intimal hyperplasia. The vein is very small throughout with diameters ranging from 0.142 0.3 cm. The upper arm cephalic vein likewise is small. I didn't irrigate with the duplex scanner myself in the basilic vein on the right also looks marginal. I looked in the right arm myself with the duplex scanner in the veins there did not look much bigger.  MEDICAL ISSUES: I have recommended that we explore his basilic vein in the left arm and consider a basilic vein transposition on the left. If this is not adequate then he would require a left arm AV graft. We would also need to ligate his fistula on the  left at the same time. I have explained the indications for placement of an AV fistula or AV graft. I've explained that if at all possible we will place an AV fistula.  I have reviewed the risks of placement of an AV fistula including but not limited to: failure of the fistula to mature, need for subsequent interventions, and thrombosis. In addition I have reviewed the potential complications of placement of an AV graft. These risks include, but are not limited to, graft thrombosis, graft infection, wound healing problems, bleeding, arm swelling, and steal syndrome. All the patient's questions were answered and they are agreeable to proceed with surgery. His surgery is scheduled for 01/16/2012.  Subhan Hoopes S [facility] of [place] Beeper: [phone]

## 2012-01-16 NOTE — Op Note (Signed)
NAME: Shane [name]    MRN: [id number] DOB: 07/15/1931    DATE OF OPERATION: 01/16/2012  PREOP DIAGNOSIS: chronic kidney disease  POSTOP DIAGNOSIS: same  PROCEDURE: left basilic vein transposition  SURGEON: Di Kindle. Edilia Bo, MD, FACS  ASSIST: Doreatha Massed, PA  ANESTHESIA: Gen.   EBL: minimal  INDICATIONS: [name] is a 78 y.o. male had a left forearm AV fistula which failed to mature. He is brought in for new access.  FINDINGS: the basilic vein was of reasonable size. The brachial artery had no significant plaque.  TECHNIQUE: The patient was brought to the operating room and received a general anesthetic. Left upper extremity was prepped and draped in the usual sterile fashion. Using 3 incisions along the medial aspect of the left upper arm, the basilic vein was harvested from the antecubital level where the vein split up to the axilla. Of note the basilic vein into into the brachial system in the upper arm and then I continued the dissection taking the brachial vein up to the distal part of the fistula. All branches were divided between clips and 3-0 silk ties. The vein was then marked with a twisting. The distal incision I exposed the brachial artery beneath the fascia. A tunnel was created from this incision to the axillary incision and the vein was brought tunnel and the patient was heparinized. Once the heparin had circulated, the brachial artery was clamped proximally and distally and a longitudinal arteriotomy was made. The vein was cut to the appropriate length spatulated and sewn end-to-side to the brachial artery using continuous 6-0 Prolene suture. At the completion was an excellent thrill in the fistula and a palpable radial pulse. The heparin was partially reversed with protamine. The wounds were closed with deep layer 3-0 Vicryl and the skin closed with Vicryl. Dermabond was applied. The patient tolerated the procedure well and was transferred to the recovery room in  stable condition. All needle and sponge counts were correct.  Waverly Ferrari, MD, FACS [facility]  DATE OF DICTATION:   01/16/2012

## 2012-01-16 NOTE — Preoperative (Signed)
Beta Blockers   Reason not to administer Beta Blockers:Not Applicable 

## 2012-01-16 NOTE — Anesthesia Preprocedure Evaluation (Addendum)
Anesthesia Evaluation  Patient identified by MRN, date of birth, ID band Patient awake    Reviewed: Allergy & Precautions, H&P , NPO status , Patient's Chart, lab work & pertinent test results, reviewed documented beta blocker date and time   Airway Mallampati: I TM Distance: >3 FB Neck ROM: Full    Dental  (+) Edentulous Lower and Edentulous Upper   Pulmonary neg pulmonary ROS, shortness of breath and with exertion,  breath sounds clear to auscultation  Pulmonary exam normal + decreased breath sounds      Cardiovascular hypertension, Pt. on medications and Pt. 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, Residual Symptoms    GI/Hepatic Neg liver ROS, hiatal hernia, GERD-  Medicated and Controlled,  Endo/Other  Diabetes mellitus-, Well Controlled, Type 2, Oral Hypoglycemic Agents  Renal/GU ESRF and DialysisRenal disease   BPH    Musculoskeletal  (+) Arthritis -, Osteoarthritis,    Abdominal   Peds  Hematology  (+) Blood dyscrasia, anemia ,   Anesthesia Other Findings   Reproductive/Obstetrics                          Anesthesia Physical Anesthesia Plan  ASA: III  Anesthesia Plan: General   Post-op Pain Management:    Induction: Intravenous  Airway Management Planned: LMA  Additional Equipment:   Intra-op Plan:   Post-operative Plan: Extubation in OR  Informed Consent: I have reviewed the patients History and Physical, chart, labs and discussed the procedure including the risks, benefits and alternatives for the proposed anesthesia with the patient or authorized representative who has indicated his/her understanding and acceptance.     Plan Discussed with: CRNA, Anesthesiologist and Surgeon  Anesthesia Plan Comments:         Anesthesia Quick Evaluation

## 2012-01-16 NOTE — Telephone Encounter (Signed)
Left voicemail for patient re: appointment for surgical follow up, dpm

## 2012-01-16 NOTE — Anesthesia Postprocedure Evaluation (Signed)
  Anesthesia Post-op Note  Patient: Shane [name]  Procedure(s) Performed: Procedure(s) (LRB): BASCILIC VEIN TRANSPOSITION (Left)  Patient Location: PACU  Anesthesia Type: General  Level of Consciousness: awake, alert  and oriented  Airway and Oxygen Therapy: Patient Spontanous Breathing  Post-op Pain: none  Post-op Assessment: Post-op Vital signs reviewed, Patient's Cardiovascular Status Stable, Respiratory Function Stable, Patent Airway, No signs of Nausea or vomiting and Pain level controlled  Post-op Vital Signs: Reviewed and stable  Complications: No apparent anesthesia complications

## 2012-01-16 NOTE — Anesthesia Procedure Notes (Signed)
Procedure Name: LMA Insertion Date/Time: 01/16/2012 7:41 AM Performed by: Marena Chancy Pre-anesthesia Checklist: Timeout performed, Patient identified, Emergency Drugs available, Suction available and Patient being monitored Patient Re-evaluated:Patient Re-evaluated prior to inductionOxygen Delivery Method: Circle system utilized Preoxygenation: Pre-oxygenation with 100% oxygen Intubation Type: IV induction Ventilation: Mask ventilation without difficulty LMA: LMA inserted LMA Size: 4.0 Number of attempts: 1 Placement Confirmation: positive ETCO2 and breath sounds checked- equal and bilateral Dental Injury: Teeth and Oropharynx as per pre-operative assessment

## 2012-01-16 NOTE — Interval H&P Note (Signed)
History and Physical Interval Note:  01/16/2012 7:18 AM  Shane [name]  has presented today for surgery, with the diagnosis of End Stage Renal Disease  The various methods of treatment have been discussed with the patient and family. After consideration of risks, benefits and other options for treatment, the patient has consented to: LEFT BASCILIC VEIN TRANSPOSITION OR LEFT ARTERIOVENOUC GRAFT.  The patients' history has been reviewed, patient examined, no change in status, stable for surgery.  I have reviewed the patients' chart and labs.  Questions were answered to the patient's satisfaction.     Lillyona Polasek S

## 2012-01-16 NOTE — Discharge Instructions (Signed)
01/16/2012 Shane [name] [id number] November 08, 1930  Surgeon(s): [name], MD  Procedure(s): BASCILIC VEIN TRANSPOSITION-left        x Do not stick graGetting Ready For Dialysis When kidneys do not work well it is called kidney failure. This is sometimes called Chronic Kidney Disease (CKD). Kidney failure may happen gradually or suddenly. Kidneys stop working well for many different reasons. The most common causes are:  Diabetes.   High blood pressure.   Kidney infection.   Cysts in the kidneys.  TREATMENT OF KIDNEY FAILURE  Kidney transplant.   Hemodialysis.   Peritoneal dialysis.  KIDNEY TRANSPLANT: A kidney from another person is put into your body. This kidney takes the place of your kidney.  Kidneys can be transplanted from:   A family member.   A friend.   A "brain-dead" person.   There are not enough kidneys for all the people who need them. Only a few people with kidney failure will get transplants.   To find out about kidney transplant, ask your doctor. Your doctor will tell you how to apply to get a kidney.  DIALYSIS: There are 2 types of dialysis:  Hemodialysis.   Peritoneal dialysis.  You and your doctor will talk about which is best for you. Before you can start dialysis, you need surgery to make the "access" (a place on your body through which dialysis can be done). The access is meant to be permanent. About Hemodialysis: There are 2 ways to access your blood.  The best type of access is an arteriovenous fistula. This makes a good connection place for dialysis. Let this new access heal for 4-6 weeks.   If veins are small or have scars and cannot be used to make a fistula, then an arteriovenous graft can be made. It should be allowed to heal for 2-4 weeks.  Access allows your blood to flow through special tubes to a machine. There your blood is cleaned by a filter. You do not feel the blood moving. Only 1 cup of blood is out of your body  at one time.  PERITONEAL DIALYSIS ACCESS: How peritoneal dialysis works: The peritoneal access is made by putting a tube into your belly. Peritoneal dialysis works inside Public relations account executive. It gets rid of waste, poisons and excess water. Your body's own membranes in the belly are used. This body part is like a thin, soft sheet which covers the organs inside your belly. This part works as the filter. During access surgery, the catheter is put directly through the wall of your abdomen.  BEFORE YOU HAVE ANY TYPE OF ACCESS SURGERY Things you can do to help yourself before surgery:  Be as healthy as you can before surgery. Stay healthy after surgery. Talk to your doctor about ways to:   Lose weight if you are too heavy.   Stop smoking.   Exercise and keep active.   Lessen stress.   Take your high blood pressure or diabetes medicine. Keep your blood pressure or blood sugar at a healthy level.   Find out all you can about kidney disease and the treatment and surgery for it. Understand what your lab tests mean. Ask questions until you understand.   If you will be getting a fistula or graft in your arm, exercise your arm muscles by squeezing and letting go of a rubber ball in your hand. Do this 4 times a day (breakfast, lunch, dinner, bedtime). Each time, squeeze the ball 125 times.  Most often the surgery is done in the Day Surgery Unit at the hospital.   For fistula or graft surgery, you will be given a shot in your under-arm so that you will have no feeling in the arm for awhile. You will also be given medicine to help you relax and maybe even sleep.   For all access surgeries, do not eat or drink for at least 8 hours before your surgery.   For peritoneal access surgery, it will be important to clean out your bowels very well. Ask your doctor which laxative to use.  TAKING CARE OF YOUR HEMODIALYSIS ACCESS AFTER SURGERY:  For 2 days keep your arm raised on pillows. Keep your arm above your heart  when resting or sleeping. Do not put a heating pad on your arm.   You may use an ice pack for the first 6 - 12 hours after surgery. A bag of frozen peas wrapped in a thin cloth makes a good cold pack. Put it outside the area of the gauze bandage. Put it on the sides and back of your arm. Never put the ice pack directly on top of your fistula or graft.   Every day, look closely at the surgery area. Watch for signs of infection. Some mild swelling and discomfort is normal for about 2 weeks.   You may also have some bruises around your fistula or graft. This is normal. A tiny bit of bleeding is also normal.   At least twice a day you should check your fistula or graft to see if it is "working". Do this with three fingertips laid lightly above the surgery area. If you have a fistula, you should feel a "buzz" or "thrill". This means blood is flowing well. If you have a graft, you should feel a strong pulse. If you do not feel anything, call your kidney doctor right away.   If you hurt, take the pain medicine your doctor prescribed. It is best to take pain medicine for only 1 or 2 days after surgery.   Usually, there are no stitches that need to be taken out. The tape strips may stay on for 7 - 10 days and fall off by themselves. A loose bandage may be put over your arm. This will cover the place where the surgery was done. Do not wrap or tape tight bandages all the way around your arm.   To keep the blood in your fistula or graft from clotting:   Do not sleep on your arm.   Do not keep your elbow bent for long periods of time. Never use a sling.   Do not wear tight sleeves or anything binding on your arm.   Do not let anyone except dialysis doctors and nurses stick needles into your fistula or graft.   Do not let the arm with the fistula or graft be used to check your blood pressure.   Do not carry heavy things with the fistula or graft arm.   Ask your doctor when you can get your arm wet. Dry  your fistula or graft by patting it gently with a clean towel. Do not rub it dry.   After 5 days, begin mild exercise of your arm. Squeeze and let go of a rubber ball (or a rolled-up sock) in your hand. Do this 4 times a day. Each time, squeeze 125 times.   Every time you go to the center for dialysis wash your access arm well. Wash it for  2 minutes with soap and water before your treatment. Pat it dry gently.   Remind your dialysis nurse to stick your access in a different place each time.   If you have any questions or problems, call the Vascular Access Nurse. Be sure you have check-up appointments at 1 - 2 weeks and 6 weeks after surgery.  CALL YOUR DOCTOR RIGHT AWAY IF:  You have no feeling, tingling or pain in your hand.   You are not able to move your hand.   Your arm is red, swollen or has pain.   Your surgery area keeps bleeding.   You have drainage of any kind from your wound.   You have a fever over 100 F (37.7 C).  POSSIBLE PROBLEMS WITH YOUR HEMODIALYSIS ACCESS Sometimes an access may have a problem such as:  Clotting.   Weak, balloon-like places on your fistula.   Not working well.  If a second surgery is needed to remove blood clots or to fix other problems, your access can still be used for dialysis soon afterward. If not your doctor will let you know.  If you have already had a second, corrective surgery, re-read Taking Care of Your Hemodialysis Access After Surgery above. TAKING CARE OF YOUR PERITONEAL DIALYSIS ACCESS AFTER SURGERY:  After surgery you will have some pain. This is normal. Take the pain medicine that your doctor prescribed for you.   It is best to take pain medicine for only 1 or 2 days after surgery. If you need to take it longer than this, ask your doctor about a mild laxative. Pain medicine can cause constipation.   For the next 2 weeks, do not lift anything that weighs more than 5 pounds. A half-gallon of milk weighs about 4 pounds. Do not  strain or tighten the muscles of your belly.   There are no stitches that need to be taken out. Leave the bandage on until you go to the dialysis unit for training.   If you have any questions or problems, call the Vascular Access Nurse. Be sure you have check up appointments at 1 - 2 weeks and 6 weeks after surgery.   Make sure you also have an appointment at your dialysis center to be trained to do your peritoneal dialysis. Call your dialysis center to start your training.  GET HELP RIGHT AWAY IF:   The place around your tube is red, swollen or has pain.   There are bumps.  Your skin feels hot. Arteriovenous (AV) Access for Hemodialysis An arteriovenous (AV) access is a surgically created or placed tube that allows for repeated access to the blood in your body. This access is required for hemodialysis, a type of dialysis. Dialysis is a treatment process that filters and cleans the blood in order to eliminate toxic wastes from the body when the kidneys fail to do this on their own. There are several different access methods used for hemodialysis. ACCESS METHODS  Double lumen catheter. A flexible tube with 2 channels may be used on a temporary or long-term basis. A long-term use catheter often has a cuff that holds it in place. The catheter is surgically placed and tunneled under the skin. This catheter is often placed when dialysis is needed in an emergency, such as when the kidneys suddenly stop working. It may also be needed when a permanent AV access fails or has not yet been placed. A catheter is usually placed into one of the following:   Large vein  under your collarbone (subclavian vein).   Large vein in your neck (jugular vein).   Temporarily, in the large vein in your groin (femoral vein).   AV graft. A man-made (synthetic) material may be used to connect an artery and vein in the arm or thigh. This graft takes around 2 weeks to develop (mature) and is often placed a few weeks prior  to use. A graft can generally last from 1 to 2 years.   AV fistula. A minor surgical procedure may be done to connect an artery and vein, creating a fistula. This causes arterial blood to flow directly into a vein. The vein gets larger, allowing easier access for dialysis. The fistula takes around 12 weeks to mature and must be placed several months before dialysis is anticipated. A fistula provides the best access for hemodialysis and can last for several years.  It is critical that veins in patients at high risk to develop kidney failure are preserved. This may include avoiding blood pressure checks and intravenous (IV) or lab draws from the arm. This maximizes the chance for creating a functioning AV access when needed. Although a fistula is the most desirable access to use for hemodialysis, it may not be possible. If the veins are not large enough or there is no time to wait for a fistula to mature, a graft or catheter may be used. RISKS AND COMPLICATIONS   Double lumen catheter. A catheter may develop serious infections. It may also develop a clot (thrombosis) and fail. A catheter can cause clots in the vein in which it is placed. A subclavian vein catheter is the most likely to cause thrombosis. When the subclavian vein clots, it makes it very difficult for the patient to sustain an AV graft or fistula. When possible, catheters should be avoided and a more permanent AV access should be placed.   AV graft. A graft may swell after surgery, but this should decrease as it heals. A graft may stop working properly due to thrombosis or the diameter of the tube getting smaller. The tube can eventually become blocked (stenosis). If a partial blockage is found relatively early, it can be treated. Left untreated, the stenosis will progress until the vessel is completely blocked. Infection may also occur.   AV fistula. Infection and thrombosis are the biggest risks with a fistula. However, the rates for  infection and stenosis are lower with fistulas than the other 2 methods. Frequent thrombosis may require creating a backup fistula at another site. This will allow for dialysis when one access is blocked.  HOME CARE INSTRUCTIONS   Keep the site of the cut (incision) clean and dry while it heals. This helps prevent infection.   If you have a catheter, do not shower while the incision is healing. After it is healed, ask your caregiver for recommendations on showering. The bandage (dressing) will be changed at the dialysis center. Do not remove this dressing. However, if it becomes wet or loose, a sterile gauze dressing may be placed over the site and secured by placing adhesive tape on the edges.   If you have a graft or fistula, clean the site daily until it heals completely. Stitches will be removed, usually after about 10 to 14 days. Once the access is being used for dialysis, a small dressing will be placed over the needle sites after the treatment is done. Keep this dressing on for at least 12 hours. Keep your arm or thigh clean and dry during this  time.   A small amount of bleeding is normal, especially if the access is new. If you have a large amount of bleeding and cannot stop it, this is not normal. Call your caregiver right away. You will be taught how to hold your graft or fistula sites to stop the bleeding.  If you have a graft or fistula:  A "bruit" is a noise that is heard with a stethoscope and a "thrill" is a vibration felt over the graft or fistula. The presence of the bruit and thrill indicate that the access is working. You will be taught to feel for the thrill each day. If this is not felt, the access may be clotted. Call your caregiver.   You may use the arm freely after the site heals. Keep the following in mind:   Avoid pressure on the arm.   Avoid lifting heavy objects with the arm.   Avoid sleeping on the arm with the graft or fistula.   Avoid wearing tight-sleeved shirts  or jewelry around the graft or fistula.   Do not allow blood pressure monitoring or needle punctures on the side where the graft or fistula is located.   With permission from your caregiver, you may do exercises to help with blood flow through a fistula. These exercises involve squeezing a rubber ball or other soft objects as instructed.  SEEK MEDICAL CARE IF:   Chills develop.   You have an oral temperature above 102 F (38.9 C).   Swelling around the graft or fistula gets worse.   New pain develops.   Unusual bleeding develops.   Pus or other fluid (drainage) is seen at the AV access site.   Skin redness or red streaking is seen on the skin around, above, or below the AV access.  SEEK IMMEDIATE MEDICAL CARE IF:   Pain, numbness, or an unusual pale skin color develops in the hand on the side of your fistula.   Dizziness or weakness develops that you have not had before.   Shortness of breath develops.   Chest pain develops.   The AV access has bleeding that cannot be easily controlled.  Wear a medical alert bracelet to let caregivers know you are a dialysis patient, so they can care for your veins appropriately. Document Released: 11/02/2002 Document Revised: 08/01/2011 Document Reviewed: 01/09/2010  [facility] Patient Information 2012 [place], [place].  Your surgery area keeps on bleeding.   You have drainage of any kind from your wound.   You have a fever over 100 F (37.7 C).  Document Released: 07/31/2009 Document Revised: 08/01/2011 Document Reviewed: 07/31/2009 [facility] Patient Information 2012 [place], [place].ft for 12 weeks

## 2012-01-16 NOTE — Transfer of Care (Signed)
Immediate Anesthesia Transfer of Care Note  Patient: Shane [name]  Procedure(s) Performed: Procedure(s) (LRB): BASCILIC VEIN TRANSPOSITION (Left)  Patient Location: PACU  Anesthesia Type: General  Level of Consciousness: awake, alert  and oriented  Airway & Oxygen Therapy: Patient Spontanous Breathing and Patient connected to nasal cannula oxygen  Post-op Assessment: Report given to PACU RN, Post -op Vital signs reviewed and stable and Patient moving all extremities X 4  Post vital signs: Reviewed and stable  Complications: No apparent anesthesia complications

## 2012-01-16 NOTE — Telephone Encounter (Signed)
Message copied by Fredrich Birks on Thu Jan 16, 2012 11:29 AM ------      Message from: Lorin Mercy K      Created: Thu Jan 16, 2012 11:02 AM      Regarding: schedule                   ----- Message -----         From: Dara Lords, PA         Sent: 01/16/2012  10:18 AM           To: Sharee Pimple, CMA            [name],Shane RMale, 78 y.o., April 18, 1931            S/p left BVT      F/u with CSD in 6 weeks.            Thanks,      Lelon Mast

## 2012-01-22 NOTE — Procedures (Unsigned)
VASCULAR LAB EXAM  INDICATION:  Nonmaturing left radiocephalic fistula.  HISTORY: Chronic kidney disease Diabetes: Cardiac: Hypertension:  EXAM: 1. Using color-flow imaging and pulse Doppler spectral analysis, the     left radiocephalic fistula was evaluated. 2. The fistula is patent with what appears to be hyperplasia in the     proximal segment of the graft at the anastomosis for a length of     approximately 4.5 cm.  The remainder of the cephalic vein is     unremarkable except for the low velocities beyond the narrowed     segment. 3. The arterial inflow is triphasic.  IMPRESSION: 1. Patent left radiocephalic fistula with narrowed segment at the     anastomosis over a length of 4.5 cm. 2. Please see attached diagram for details.  ___________________________________________ Di Kindle. Edilia Bo, M.D.  LT/MEDQ  D:  01/08/2012  T:  01/08/2012  Job:  [id number]

## 2012-03-03 ENCOUNTER — Encounter: Payer: Self-pay | Admitting: Vascular Surgery

## 2012-03-04 ENCOUNTER — Encounter: Payer: Self-pay | Admitting: Vascular Surgery

## 2012-03-04 ENCOUNTER — Ambulatory Visit (INDEPENDENT_AMBULATORY_CARE_PROVIDER_SITE_OTHER): Payer: Medicare Other | Admitting: Vascular Surgery

## 2012-03-04 VITALS — BP 171/71 | HR 57 | Temp 98.5°F | Ht 66.0 in | Wt 188.0 lb

## 2012-03-04 DIAGNOSIS — N186 End stage renal disease: Secondary | ICD-10-CM

## 2012-03-04 DIAGNOSIS — Z4931 Encounter for adequacy testing for hemodialysis: Secondary | ICD-10-CM

## 2012-03-04 NOTE — Progress Notes (Signed)
Vascular and Vein Specialist of [facility]  Patient name: Shane [name] MRN: [id number] DOB: 09/03/1930 Sex: male  REASON FOR VISIT: follow up after left basilic vein transposition  HPI: Shane [name] is a 78 y.o. male who had a left basilic vein transposition on 01/16/2012. Comes in for a 6 week follow up visit. Of note he is not on dialysis. He's had no pain in the left arm or left hand.   REVIEW OF SYSTEMS: Arly.Keller ] denotes positive finding; [  ] denotes negative finding  CARDIOVASCULAR:  [ ]  chest pain   [ ]  dyspnea on exertion    CONSTITUTIONAL:  [ ]  fever   [ ]  chills  PHYSICAL EXAM: Filed Vitals:   03/04/12 1441  BP: 171/71  Pulse: 57  Temp: 98.5 F (36.9 C)  TempSrc: Oral  Height: 5\' 6"  (1.676 m)  Weight: 188 lb (85.276 kg)  SpO2: 100%   Body mass index is 30.34 kg/(m^2). GENERAL: The patient is a well-nourished male, in no acute distress. The vital signs are documented above. CARDIOVASCULAR: There is a regular rate and rhythm  PULMONARY: There is good air exchange bilaterally without wheezing or rales. The fistula has a good thrill. The vein appears to be maturing gradually. It is still not especially large. He has a palpable left radial pulse. His incisions are healing nicely.  MEDICAL ISSUES: I will arrange to see him back in 3-4 weeks with a duplex of his fistula at that time. If there are any issues at that time we would consider a fistulogram with limited dye.  Shane [name] S [facility] [place] [facility]: [phone]

## 2012-03-05 NOTE — Addendum Note (Signed)
Addended by: Sharee Pimple on: 03/05/2012 08:36 AM   Modules accepted: Orders

## 2012-03-24 ENCOUNTER — Encounter: Payer: Self-pay | Admitting: Vascular Surgery

## 2012-03-25 ENCOUNTER — Encounter (INDEPENDENT_AMBULATORY_CARE_PROVIDER_SITE_OTHER): Payer: Medicare Other | Admitting: *Deleted

## 2012-03-25 ENCOUNTER — Encounter: Payer: Self-pay | Admitting: Vascular Surgery

## 2012-03-25 ENCOUNTER — Ambulatory Visit (INDEPENDENT_AMBULATORY_CARE_PROVIDER_SITE_OTHER): Payer: Medicare Other | Admitting: Vascular Surgery

## 2012-03-25 VITALS — BP 147/69 | HR 53 | Temp 98.4°F | Ht 66.0 in | Wt 185.0 lb

## 2012-03-25 DIAGNOSIS — N186 End stage renal disease: Secondary | ICD-10-CM

## 2012-03-25 DIAGNOSIS — Z4931 Encounter for adequacy testing for hemodialysis: Secondary | ICD-10-CM

## 2012-03-25 NOTE — Progress Notes (Signed)
Vascular and Vein Specialist of [facility]  Patient name: Shane [name] MRN: [id number] DOB: Jan 23, 1931 Sex: male  REASON FOR VISIT: follow up of left arm AV fistula  HPI: Shane [name] is a 78 y.o. male who had a left basilic vein transposition on 01/16/2012. He comes in for a routine follow up visit in duplex scan of his fistula. He's had no pain in the left arm and no pain or paresthesias in the left hand.   REVIEW OF SYSTEMS: Arly.Keller ] denotes positive finding; [  ] denotes negative finding  CARDIOVASCULAR:  [ ]  chest pain   [ ]  dyspnea on exertion    CONSTITUTIONAL:  [ ]  fever   [ ]  chills  PHYSICAL EXAM: Filed Vitals:   03/25/12 1400  BP: 147/69  Pulse: 53  Temp: 98.4 F (36.9 C)  TempSrc: Oral  Height: 5\' 6"  (1.676 m)  Weight: 185 lb (83.915 kg)  SpO2: 100%   Body mass index is 29.86 kg/(m^2). GENERAL: The patient is a well-nourished male, in no acute distress. The vital signs are documented above. CARDIOVASCULAR: There is a regular rate and rhythm  PULMONARY: There is good air exchange bilaterally without wheezing or rales. The fistula is somewhat pulsatile proximally.   His duplex scan shows an area of increased velocities in the mid segment of the fistula with velocities at the 492 cm/s. This is the only real problem is identified. The vein is not adequate in size with diameters ranging from 0.2 cm to 0.5 cm.   MEDICAL ISSUES:  End stage renal disease His upper arm fistula has an area of stenosis in the midportion of the fistula. The remainder the fistula looks good. I have recommended that we proceed with a fistulogram and venoplasty of the area of concern. Otherwise, I do not think that the fistula will mature adequately. I have discussed the indications for the fistulogram and venoplasty and the potential complications. He is agreeable to proceed. This is scheduled for 04/06/2012.   Takeira Yanes S [facility] [place] [facility]:  [phone]

## 2012-03-25 NOTE — Assessment & Plan Note (Signed)
His upper arm fistula has an area of stenosis in the midportion of the fistula. The remainder the fistula looks good. I have recommended that we proceed with a fistulogram and venoplasty of the area of concern. Otherwise, I do not think that the fistula will mature adequately. I have discussed the indications for the fistulogram and venoplasty and the potential complications. He is agreeable to proceed. This is scheduled for 04/06/2012.

## 2012-03-26 ENCOUNTER — Other Ambulatory Visit: Payer: Self-pay

## 2012-03-27 ENCOUNTER — Other Ambulatory Visit: Payer: Self-pay

## 2012-03-27 ENCOUNTER — Encounter (HOSPITAL_COMMUNITY): Payer: Self-pay | Admitting: Pharmacy Technician

## 2012-04-01 NOTE — Procedures (Unsigned)
VASCULAR LAB EXAM  INDICATION:  End-stage renal disease, encounter for adequacy testing for hemodialysis.  HISTORY: Diabetes: Cardiac: Hypertension:  EXAM:  Left brachiocephalic AV fistula duplex.  IMPRESSION: 1. Patent left brachiocephalic arteriovenous fistula with velocities     suggestive of hemodynamically significant stenoses in the distal     and mid outflow vein levels.  These areas appear to be due to the     presence of a frozen valve leaflet and a decrease in vessel size,     respectively. 2. The left radial artery flow appears triphasic and antegrade. 3. Depth, diameter, and velocity measurements are noted on the     attached worksheet.  ___________________________________________ Di Kindle. Edilia Bo, M.D.  CH/MEDQ  D:  03/26/2012  T:  03/26/2012  Job:  [id number]

## 2012-04-05 MED ORDER — SODIUM CHLORIDE 0.9 % IJ SOLN: 3.0000 mL | INTRAMUSCULAR | Status: DC | PRN

## 2012-04-06 ENCOUNTER — Ambulatory Visit (HOSPITAL_COMMUNITY)
Admission: RE | Admit: 2012-04-06 | Discharge: 2012-04-06 | Disposition: A | Discharge status: Home / Self Care | Payer: Medicare Other | Source: Ambulatory Visit | Attending: Vascular Surgery | Admitting: Vascular Surgery

## 2012-04-06 ENCOUNTER — Other Ambulatory Visit: Payer: Self-pay

## 2012-04-06 ENCOUNTER — Other Ambulatory Visit: Payer: Self-pay | Admitting: *Deleted

## 2012-04-06 ENCOUNTER — Telehealth: Payer: Self-pay | Admitting: Vascular Surgery

## 2012-04-06 ENCOUNTER — Encounter (HOSPITAL_COMMUNITY)
Admission: RE | Disposition: A | Discharge status: Home / Self Care | Payer: Self-pay | Source: Ambulatory Visit | Attending: Vascular Surgery

## 2012-04-06 DIAGNOSIS — N186 End stage renal disease: Secondary | ICD-10-CM | POA: Insufficient documentation

## 2012-04-06 DIAGNOSIS — T82898A Other specified complication of vascular prosthetic devices, implants and grafts, initial encounter: Secondary | ICD-10-CM | POA: Insufficient documentation

## 2012-04-06 DIAGNOSIS — Z4931 Encounter for adequacy testing for hemodialysis: Secondary | ICD-10-CM

## 2012-04-06 DIAGNOSIS — Y832 Surgical operation with anastomosis, bypass or graft as the cause of abnormal reaction of the patient, or of later complication, without mention of misadventure at the time of the procedure: Secondary | ICD-10-CM | POA: Insufficient documentation

## 2012-04-06 DIAGNOSIS — N189 Chronic kidney disease, unspecified: Secondary | ICD-10-CM

## 2012-04-06 DIAGNOSIS — I871 Compression of vein: Secondary | ICD-10-CM | POA: Insufficient documentation

## 2012-04-06 HISTORY — PX: FISTULOGRAM: SHX5832

## 2012-04-06 HISTORY — PX: VENOPLASTY: SHX2655

## 2012-04-06 LAB — POCT I-STAT, CHEM 8
BUN: 45 mg/dL — ABNORMAL HIGH (ref 6–23)
Chloride: 107 mEq/L (ref 96–112)
Creatinine, Ser: 4.1 mg/dL — ABNORMAL HIGH (ref 0.50–1.35)
Glucose, Bld: 127 mg/dL — ABNORMAL HIGH (ref 70–99)
Hemoglobin: 9.5 g/dL — ABNORMAL LOW (ref 13.0–17.0)
Potassium: 3.3 mEq/L — ABNORMAL LOW (ref 3.5–5.1)

## 2012-04-06 SURGERY — FISTULOGRAM
Anesthesia: LOCAL | Laterality: Left

## 2012-04-06 MED ORDER — HEPARIN SODIUM (PORCINE) 1000 UNIT/ML IJ SOLN
INTRAMUSCULAR | Status: AC
  Filled 2012-04-06: qty 1

## 2012-04-06 MED ORDER — HEPARIN (PORCINE) IN NACL 2-0.9 UNIT/ML-% IJ SOLN
INTRAMUSCULAR | Status: AC
  Filled 2012-04-06: qty 1000

## 2012-04-06 MED ORDER — SODIUM CHLORIDE 0.9 % IV SOLN: INTRAVENOUS | Status: AC

## 2012-04-06 MED ORDER — MUPIROCIN 2 % EX OINT: TOPICAL_OINTMENT | Freq: Two times a day (BID) | CUTANEOUS | Status: DC

## 2012-04-06 MED ORDER — MIDAZOLAM HCL 2 MG/2ML IJ SOLN
INTRAMUSCULAR | Status: AC
  Filled 2012-04-06: qty 2

## 2012-04-06 MED ORDER — FENTANYL CITRATE 0.05 MG/ML IJ SOLN
INTRAMUSCULAR | Status: AC
  Filled 2012-04-06: qty 2

## 2012-04-06 MED ORDER — LIDOCAINE HCL (PF) 1 % IJ SOLN
INTRAMUSCULAR | Status: AC
  Filled 2012-04-06: qty 30

## 2012-04-06 NOTE — Interval H&P Note (Signed)
History and Physical Interval Note:  04/06/2012 9:08 AM  Shane [name]  has presented today for surgery, with the diagnosis of end stage renal disease  The various methods of treatment have been discussed with the patient and family. After consideration of risks, benefits and other options for treatment, the patient has consented to: FISTULOGRAM OF LEFT BASILIC VEIN TRANSPOSITION AND POSSIBLE VENOPLASTY.  The patient's history has been reviewed, patient examined, no change in status, stable for surgery.  I have reviewed the patient's chart and labs.  Questions were answered to the patient's satisfaction.     DICKSON,CHRISTOPHER S

## 2012-04-06 NOTE — Op Note (Signed)
PATIENT: Shane [name]   MRN: [id number] DOB: 1931-08-22    DATE OF PROCEDURE: 04/06/2012  INDICATIONS: SANJUAN SAWA is a 78 y.o. male who had a left basilic vein transposition performed on 01/16/2012. Follow duplex scan he was noted to have an area of stenosis in the central part of this fistula. He is brought in for fistulogram and possible venoplasty.  PROCEDURE:  1. Ultrasound-guided access to the left upper arm AV fistula 2. Fistulogram of left basilic vein transposition 3. Venoplasty of basilic vein stenosis with a 4 mm x 4 cm balloon  SURGEON: Di Kindle. Edilia Bo, MD, FACS  ANESTHESIA: local with sedation  EBL: minimal  TECHNIQUE: The patient was taken to the bursal vascular lab and received 1 mg of Versed and 50 mcg of fentanyl. The left arm was prepped and draped in the usual sterile fashion. Under ultrasound guidance and after the skin was anesthetized the proximal AV fistula was cannulated with a micropuncture needle and a micropuncture wire was introduced. The micropuncture sheath was introduced over the wire. A fistulogram was then obtained demonstrating a fairly long segment stenosis over a length of approximately 4 cm in the central fistula. There was also a focal stenosis further centrally over a short distance. The proximal fistula has mild diffuse disease. The micropuncture sheath was exchanged for a short 6 [place] sheath. This was done over a Transport planner. A 4 mm x 4 cm balloon was positioned across the more central stenosis and the balloon inflated to 10 atmospheres for 1 minute. Next the balloon was positioned across the more significant stenosis in the central vein and this was inflated to up to 18 atmospheres or 1 minute. Follow up film demonstrated some mild extravasation therefore repositioned the balloon is inflated to 7 atmospheres and maintained for 2 minutes. Follow up films showed no further extravasation. At the completion the sheath resume removed after a 4-0 monocril  suture was placed for hemostasis. No immediate complications were noted.  FINDINGS:  1. There was a tight stenosis in the central portion of the AV fistula which was successfully ballooned as described above. There was a more focal stenosis in the more central fistula which was also balloon successfully. There was poor visualization of the central veins but I wanted to limit the contrast load is the patient is not yet on dialysis. It was some mild diffuse disease of the proximal fistula.  Waverly Ferrari, MD, FACS [facility]  DATE OF DICTATION:   04/06/2012

## 2012-04-06 NOTE — H&P (View-Only) (Signed)
[facility]ist of [place]  Patient name: Shane [name] MRN: [id number] DOB: 07/28/1931 Sex: male  REASON FOR VISIT: follow up of left arm AV fistula  HPI: Kalei R Mondry is a 78 y.o. male who had a left basilic vein transposition on 01/16/2012. He comes in for a routine follow up visit in duplex scan of his fistula. He's had no pain in the left arm and no pain or paresthesias in the left hand.   REVIEW OF SYSTEMS: [X ] denotes positive finding; [  ] denotes negative finding  CARDIOVASCULAR:  [ ] chest pain   [ ] dyspnea on exertion    CONSTITUTIONAL:  [ ] fever   [ ] chills  PHYSICAL EXAM: Filed Vitals:   03/25/12 1400  BP: 147/69  Pulse: 53  Temp: 98.4 F (36.9 C)  TempSrc: Oral  Height: 5' 6" (1.676 m)  Weight: 185 lb (83.915 kg)  SpO2: 100%   Body mass index is 29.86 kg/(m^2). GENERAL: The patient is a well-nourished male, in no acute distress. The vital signs are documented above. CARDIOVASCULAR: There is a regular rate and rhythm  PULMONARY: There is good air exchange bilaterally without wheezing or rales. The fistula is somewhat pulsatile proximally.   His duplex scan shows an area of increased velocities in the mid segment of the fistula with velocities at the 492 cm/s. This is the only real problem is identified. The vein is not adequate in size with diameters ranging from 0.2 cm to 0.5 cm.   MEDICAL ISSUES:  End stage renal disease His upper arm fistula has an area of stenosis in the midportion of the fistula. The remainder the fistula looks good. I have recommended that we proceed with a fistulogram and venoplasty of the area of concern. Otherwise, I do not think that the fistula will mature adequately. I have discussed the indications for the fistulogram and venoplasty and the potential complications. He is agreeable to proceed. This is scheduled for 04/06/2012.   DICKSON,CHRISTOPHER S [facility] of [place] Beeper:  [phone]     

## 2012-04-06 NOTE — Telephone Encounter (Signed)
Message copied by Fredrich Birks on Mon Apr 06, 2012  4:36 PM ------      Message from: Phillips Odor      Created: Mon Apr 06, 2012  1:59 PM      Regarding: FW: charge and follow up                   ----- Message -----         From: [name], MD         Sent: 04/06/2012  10:09 AM           To: Reuel Derby, Melene Plan, RN      Subject: charge and follow up                                     PROCEDURE:       1. Ultrasound-guided access to the left upper arm AV fistula      2. Fistulogram of left basilic vein transposition      3. Venoplasty of basilic vein stenosis with a 4 mm x 4 cm balloon            SURGEON: Di Kindle. Edilia Bo, MD, FACS            He'll need a follow up visit in 3 weeks with an ultrasound of his fistula. Thank you.CSD

## 2012-04-06 NOTE — Telephone Encounter (Signed)
LVM for patient regarding follow up, dpm  °

## 2012-04-29 ENCOUNTER — Ambulatory Visit: Payer: Medicare Other | Admitting: Vascular Surgery

## 2012-05-05 ENCOUNTER — Encounter: Payer: Self-pay | Admitting: Vascular Surgery

## 2012-05-06 ENCOUNTER — Ambulatory Visit (INDEPENDENT_AMBULATORY_CARE_PROVIDER_SITE_OTHER): Payer: Medicare Other | Admitting: Vascular Surgery

## 2012-05-06 ENCOUNTER — Encounter (INDEPENDENT_AMBULATORY_CARE_PROVIDER_SITE_OTHER): Payer: Medicare Other | Admitting: *Deleted

## 2012-05-06 ENCOUNTER — Encounter: Payer: Self-pay | Admitting: Vascular Surgery

## 2012-05-06 VITALS — BP 146/67 | HR 50 | Resp 16 | Ht 66.0 in | Wt 185.2 lb

## 2012-05-06 DIAGNOSIS — N186 End stage renal disease: Secondary | ICD-10-CM

## 2012-05-06 DIAGNOSIS — Z4931 Encounter for adequacy testing for hemodialysis: Secondary | ICD-10-CM

## 2012-05-06 NOTE — Progress Notes (Signed)
[facility]in Specialist of [facility]  Patient name: Shane [name] MRN: [id number] DOB: February 10, 1931 Sex: male  REASON FOR VISIT: follow up of left basilic vein transposition  HPI: Shane [name] is a 78 y.o. male who had a basilic vein transposition on the left on 01/16/2012. On 04/06/2012 he had venoplasty of 2 areas of stenosis within the fistula. He comes in for follow up visit. He is not yet on dialysis. He has had no pain or paresthesias in the left upper extremity.   REVIEW OF SYSTEMS: Arly.Keller ] denotes positive finding; [  ] denotes negative finding  CARDIOVASCULAR:  [ ]  chest pain   [ ]  dyspnea on exertion    CONSTITUTIONAL:  [ ]  fever   [ ]  chills  PHYSICAL EXAM: Filed Vitals:   05/06/12 1433  BP: 146/67  Pulse: 50  Resp: 16  Height: 5\' 6"  (1.676 m)  Weight: 185 lb 3.2 oz (84.006 kg)  SpO2: 100%   Body mass index is 29.89 kg/(m^2). GENERAL: The patient is a well-nourished male, in no acute distress. The vital signs are documented above. CARDIOVASCULAR: There is a regular rate and rhythm  PULMONARY: There is good air exchange bilaterally without wheezing or rales. The fistula does have a good thrill although there are areas which appeared be narrowed and sclerotic by exam.  I have independently interpreted his duplex of his fistula which shows 3 areas of narrowing within the fistula.  MEDICAL ISSUES:  End stage renal disease Duplex scan today suggests multiple areas of stenosis within his AV fistula although by exam the fistula appears to have a reasonable thrill. However based on the duplex findings I do not think this would be adequate for access. Before abandoning this fistula I have recommended we proceed with a fistulogram with limited contrast to try to perform venoplasty again of the hopes of salvaging this fistula otherwise he will require placement of a fistula in the right arm and the vein is in the right do not look significantly better than on the left. This has  been scheduled for 06/01/2012. We have discussed the procedure potential complications and he is agreeable to proceed.   Shane [name] S [facility] of [place] Beeper: [phone]

## 2012-05-06 NOTE — Assessment & Plan Note (Signed)
Duplex scan today suggests multiple areas of stenosis within his AV fistula although by exam the fistula appears to have a reasonable thrill. However based on the duplex findings I do not think this would be adequate for access. Before abandoning this fistula I have recommended we proceed with a fistulogram with limited contrast to try to perform venoplasty again of the hopes of salvaging this fistula otherwise he will require placement of a fistula in the right arm and the vein is in the right do not look significantly better than on the left. This has been scheduled for 06/01/2012. We have discussed the procedure potential complications and he is agreeable to proceed.

## 2012-05-22 ENCOUNTER — Other Ambulatory Visit: Payer: Self-pay

## 2012-05-25 ENCOUNTER — Encounter (HOSPITAL_COMMUNITY): Payer: Self-pay | Admitting: Pharmacist

## 2012-06-01 ENCOUNTER — Telehealth: Payer: Self-pay | Admitting: Vascular Surgery

## 2012-06-01 ENCOUNTER — Ambulatory Visit (HOSPITAL_COMMUNITY)
Admission: RE | Admit: 2012-06-01 | Discharge: 2012-06-01 | Disposition: A | Discharge status: Home / Self Care | Payer: Medicare Other | Source: Ambulatory Visit | Attending: Vascular Surgery | Admitting: Vascular Surgery

## 2012-06-01 ENCOUNTER — Encounter (HOSPITAL_COMMUNITY)
Admission: RE | Disposition: A | Discharge status: Home / Self Care | Payer: Self-pay | Source: Ambulatory Visit | Attending: Vascular Surgery

## 2012-06-01 DIAGNOSIS — N186 End stage renal disease: Secondary | ICD-10-CM | POA: Insufficient documentation

## 2012-06-01 DIAGNOSIS — T82898A Other specified complication of vascular prosthetic devices, implants and grafts, initial encounter: Secondary | ICD-10-CM

## 2012-06-01 DIAGNOSIS — Y832 Surgical operation with anastomosis, bypass or graft as the cause of abnormal reaction of the patient, or of later complication, without mention of misadventure at the time of the procedure: Secondary | ICD-10-CM | POA: Insufficient documentation

## 2012-06-01 DIAGNOSIS — I871 Compression of vein: Secondary | ICD-10-CM | POA: Insufficient documentation

## 2012-06-01 HISTORY — PX: SHUNTOGRAM: SHX5491

## 2012-06-01 LAB — POCT I-STAT, CHEM 8
BUN: 47 mg/dL — ABNORMAL HIGH (ref 6–23)
Calcium, Ion: 1.26 mmol/L (ref 1.13–1.30)
Chloride: 108 mEq/L (ref 96–112)
Creatinine, Ser: 4.6 mg/dL — ABNORMAL HIGH (ref 0.50–1.35)
Glucose, Bld: 104 mg/dL — ABNORMAL HIGH (ref 70–99)

## 2012-06-01 LAB — GLUCOSE, CAPILLARY: Glucose-Capillary: 97 mg/dL (ref 70–99)

## 2012-06-01 SURGERY — ASSESSMENT, SHUNT FUNCTION, WITH CONTRAST RADIOGRAPHIC STUDY
Anesthesia: LOCAL

## 2012-06-01 MED ORDER — LIDOCAINE HCL (PF) 1 % IJ SOLN
INTRAMUSCULAR | Status: AC
  Filled 2012-06-01: qty 30

## 2012-06-01 MED ORDER — HEPARIN (PORCINE) IN NACL 2-0.9 UNIT/ML-% IJ SOLN
INTRAMUSCULAR | Status: AC
  Filled 2012-06-01: qty 500

## 2012-06-01 MED ORDER — HEPARIN SODIUM (PORCINE) 1000 UNIT/ML IJ SOLN
INTRAMUSCULAR | Status: AC
  Filled 2012-06-01: qty 1

## 2012-06-01 MED ORDER — SODIUM CHLORIDE 0.9 % IJ SOLN: 3.0000 mL | INTRAMUSCULAR | Status: DC | PRN

## 2012-06-01 NOTE — H&P (View-Only) (Signed)
[facility]in Specialist of [place]  Patient name: Shane [name] MRN: [id number] DOB: 09/12/1930 Sex: male  REASON FOR VISIT: follow up of left basilic vein transposition  HPI: Shane [name] is a 78 y.o. male who had a basilic vein transposition on the left on 01/16/2012. On 04/06/2012 he had venoplasty of 2 areas of stenosis within the fistula. He comes in for follow up visit. He is not yet on dialysis. He has had no pain or paresthesias in the left upper extremity.   REVIEW OF SYSTEMS: [X ] denotes positive finding; [  ] denotes negative finding  CARDIOVASCULAR:  [ ] chest pain   [ ] dyspnea on exertion    CONSTITUTIONAL:  [ ] fever   [ ] chills  PHYSICAL EXAM: Filed Vitals:   05/06/12 1433  BP: 146/67  Pulse: 50  Resp: 16  Height: 5' 6" (1.676 m)  Weight: 185 lb 3.2 oz (84.006 kg)  SpO2: 100%   Body mass index is 29.89 kg/(m^2). GENERAL: The patient is a well-nourished male, in no acute distress. The vital signs are documented above. CARDIOVASCULAR: There is a regular rate and rhythm  PULMONARY: There is good air exchange bilaterally without wheezing or rales. The fistula does have a good thrill although there are areas which appeared be narrowed and sclerotic by exam.  I have independently interpreted his duplex of his fistula which shows 3 areas of narrowing within the fistula.  MEDICAL ISSUES:  End stage renal disease Duplex scan today suggests multiple areas of stenosis within his AV fistula although by exam the fistula appears to have a reasonable thrill. However based on the duplex findings I do not think this would be adequate for access. Before abandoning this fistula I have recommended we proceed with a fistulogram with limited contrast to try to perform venoplasty again of the hopes of salvaging this fistula otherwise he will require placement of a fistula in the right arm and the vein is in the right do not look significantly better than on the left. This has  been scheduled for 06/01/2012. We have discussed the procedure potential complications and he is agreeable to proceed.   Erik Burkett S [facility] of [place] Beeper: [phone]     

## 2012-06-01 NOTE — Op Note (Signed)
PATIENT: Shane [name]   MRN: [id number] DOB: 09-Mar-1931    DATE OF PROCEDURE: 06/01/2012  INDICATIONS: [name] is a 78 y.o. male who is not yet on dialysis. He had a left basilic vein transposition performed on 01/16/2012. He has undergone previous venoplasty follow up visit was noted to have recurrent stenoses in his left basilic vein transposition. Before trying new access in the right arm felt it was worth attempting venoplasty one last time on the left.  PROCEDURE:  1. Ultrasound-guided access to the left upper arm AV fistula 2. fistulogram of left basilic vein transposition 3. Venoplasty of 2 stenoses of left basilic vein transposition  SURGEON: Di Kindle. Edilia Bo, MD, FACS  ANESTHESIA: local   EBL: minimal  TECHNIQUE: The patient was brought to the peripheral vascular lab. The left arm was prepped and draped in the usual sterile fashion. After the skin was anesthetized with 1% lidocaine, and under ultrasound guidance, the left AV fistula was cannulated and a guidewire introduced. A micropuncture sheath was introduced over the wire. Fistulogram was obtained which showed 2 areas of stenosis in the fistula. Patient received 2000 units of IV heparin.  The micropuncture sheath was exchanged for a 5 French short sheath over a woolly wire. A 4 mm x 4 cm balloon was positioned across the more distal stenosis and inflated to 24 atmospheres. There appeared to be some residual stenosis. This balloon was then deflated and advanced to the more central stenosis and venoplasty performed again to 24 atmospheres for 1 minute. Given the residual stenosis at the more peripheral lesion, I used a 5 mm x 2 cm balloon which again was inflated to 24 atmospheres again with some residual stenosis. Therefore, I placed a core wire and a 5 mm x 4 cm angiosculpt balloon was positioned across the more peripheral stenosis. This was inflated to 14 atmospheres. There was still some residual waste. Therefore final  attempt was with a 5 mm x 1.5 cm cutting balloon which was inflated to 18 atmospheres. There was still a small residual stenosis noted.  FINDINGS:  1. Successful venoplasty of the more central stenosis in the basilic vein transposition. 2. The more peripheral stenosis still had a small waist present at the completion despite using a cutting balloon which was oversized.  The patient will follow up in the office he may potentially require surgical revision of the stenosis.  Waverly Ferrari, MD, FACS [facility] of [facility]  DATE OF DICTATION:   06/01/2012

## 2012-06-01 NOTE — Telephone Encounter (Signed)
Message copied by Margaretmary Eddy on Mon Jun 01, 2012 11:58 AM ------      Message from: Melene Plan      Created: Mon Jun 01, 2012 10:59 AM      Regarding: FW: charge and F/U                   ----- Message -----         From: [name], MD         Sent: 06/01/2012  10:39 AM           To: Reuel Derby, Melene Plan, RN      Subject: charge and F/U                                           PROCEDURE:       1. Ultrasound-guided access to the left upper arm AV fistula      2. fistulogram of left basilic vein transposition      3. Venoplasty of 2 stenoses of left basilic vein transposition            SURGEON: Di Kindle. Edilia Bo, MD, FACS            He will need a follow up visit in 2-3 weeks.            CSD

## 2012-06-01 NOTE — Interval H&P Note (Signed)
History and Physical Interval Note:  06/01/2012 9:22 AM  Shane [name]  has presented today for surgery, with the diagnosis of End stage renal  The various methods of treatment have been discussed with the patient and family. After consideration of risks, benefits and other options for treatment, the patient has consented to shuntogram of left arteriovenous fistula with possible venoplasty  as a surgical intervention .  The patient's history has been reviewed, patient examined, no change in status, stable for surgery.  I have reviewed the patient's chart and labs.  Questions were answered to the patient's satisfaction.     DICKSON,CHRISTOPHER S

## 2012-06-16 ENCOUNTER — Encounter: Payer: Self-pay | Admitting: Vascular Surgery

## 2012-06-17 ENCOUNTER — Ambulatory Visit (INDEPENDENT_AMBULATORY_CARE_PROVIDER_SITE_OTHER): Payer: Medicare Other | Admitting: Vascular Surgery

## 2012-06-17 ENCOUNTER — Encounter: Payer: Self-pay | Admitting: Vascular Surgery

## 2012-06-17 VITALS — BP 145/66 | HR 54 | Ht 66.0 in | Wt 192.0 lb

## 2012-06-17 DIAGNOSIS — N186 End stage renal disease: Secondary | ICD-10-CM

## 2012-06-17 NOTE — Assessment & Plan Note (Signed)
This patient's left basilic vein transposition has a reasonable thrill. He has now undergone venoplasty twice for vein graft stenoses which developed within his fistula. He is not yet on dialysis. Previous vein map shows that the veins in the right arm are not any better than the veins on the left side. This reason I think we should continue to nurse this fistula along. Hopefully his renal function will remain stable. If he does require dialysis I think it would be reasonable to use the fistula. I will see him back as needed.

## 2012-06-17 NOTE — Progress Notes (Signed)
**Note Shane-Identified via Obfuscation** Vascular and Vein Specialist of [facility]  Patient name: Shane [name] MRN: [id number] DOB: Jan 05, 1931 Sex: male  REASON FOR VISIT: follow up after venoplasty of left basilic vein transposition  HPI: Shane [name] is a 78 y.o. male who had a left basilic vein transposition performed on 01/16/2012. He underwent venoplasty of the fistula in August and then most recently had developed 2 recurrent stenoses within the fistula and had repeat venoplasty on 06/01/2012. He had a reasonable result but did have some mild residual stenosis despite using a cutting balloon. He comes in for follow up visit. He is not yet on dialysis.   REVIEW OF SYSTEMS: Arly.Keller ] denotes positive finding; [  ] denotes negative finding  CARDIOVASCULAR:  [ ]  chest pain   [ ]  dyspnea on exertion    CONSTITUTIONAL:  [ ]  fever   [ ]  chills  PHYSICAL EXAM: Filed Vitals:   06/17/12 1007  BP: 145/66  Pulse: 54  Height: 5\' 6"  (1.676 m)  Weight: 192 lb (87.091 kg)  SpO2: 100%   Body mass index is 30.99 kg/(m^2). GENERAL: The patient is a well-nourished male, in no acute distress. The vital signs are documented above. CARDIOVASCULAR: There is a regular rate and rhythm  PULMONARY: There is good air exchange bilaterally without wheezing or rales. The fistula has a good thrill. He has a palpable left radial pulse the  MEDICAL ISSUES:  End stage renal disease This patient's left basilic vein transposition has a reasonable thrill. He has now undergone venoplasty twice for vein graft stenoses which developed within his fistula. He is not yet on dialysis. Previous vein map shows that the veins in the right arm are not any better than the veins on the left side. This reason I think we should continue to nurse this fistula along. Hopefully his renal function will remain stable. If he does require dialysis I think it would be reasonable to use the fistula. I will see him back as needed.   Arnisha Laffoon S [facility]  Specialists of [place] Beeper: [phone]

## 2012-09-01 ENCOUNTER — Other Ambulatory Visit: Payer: Self-pay | Admitting: *Deleted

## 2012-09-01 DIAGNOSIS — T82898A Other specified complication of vascular prosthetic devices, implants and grafts, initial encounter: Secondary | ICD-10-CM

## 2012-09-01 DIAGNOSIS — N185 Chronic kidney disease, stage 5: Secondary | ICD-10-CM

## 2012-09-06 ENCOUNTER — Inpatient Hospital Stay (HOSPITAL_COMMUNITY)
Admission: AD | Admit: 2012-09-06 | Discharge: 2012-09-09 | DRG: 064 | Disposition: A | Discharge status: Home / Self Care | Payer: Medicare Other | Source: Other Acute Inpatient Hospital | Attending: Internal Medicine | Admitting: Internal Medicine

## 2012-09-06 ENCOUNTER — Encounter (HOSPITAL_COMMUNITY): Payer: Self-pay | Admitting: Internal Medicine

## 2012-09-06 DIAGNOSIS — N186 End stage renal disease: Secondary | ICD-10-CM | POA: Diagnosis present

## 2012-09-06 DIAGNOSIS — F329 Major depressive disorder, single episode, unspecified: Secondary | ICD-10-CM | POA: Diagnosis present

## 2012-09-06 DIAGNOSIS — I10 Essential (primary) hypertension: Secondary | ICD-10-CM | POA: Diagnosis present

## 2012-09-06 DIAGNOSIS — N2581 Secondary hyperparathyroidism of renal origin: Secondary | ICD-10-CM | POA: Diagnosis present

## 2012-09-06 DIAGNOSIS — N039 Chronic nephritic syndrome with unspecified morphologic changes: Secondary | ICD-10-CM | POA: Diagnosis present

## 2012-09-06 DIAGNOSIS — G459 Transient cerebral ischemic attack, unspecified: Secondary | ICD-10-CM | POA: Clinically undetermined

## 2012-09-06 DIAGNOSIS — E1129 Type 2 diabetes mellitus with other diabetic kidney complication: Secondary | ICD-10-CM | POA: Diagnosis present

## 2012-09-06 DIAGNOSIS — I2510 Atherosclerotic heart disease of native coronary artery without angina pectoris: Secondary | ICD-10-CM | POA: Diagnosis present

## 2012-09-06 DIAGNOSIS — Z79899 Other long term (current) drug therapy: Secondary | ICD-10-CM

## 2012-09-06 DIAGNOSIS — N189 Chronic kidney disease, unspecified: Secondary | ICD-10-CM

## 2012-09-06 DIAGNOSIS — Z992 Dependence on renal dialysis: Secondary | ICD-10-CM

## 2012-09-06 DIAGNOSIS — R55 Syncope and collapse: Secondary | ICD-10-CM | POA: Diagnosis present

## 2012-09-06 DIAGNOSIS — I6350 Cerebral infarction due to unspecified occlusion or stenosis of unspecified cerebral artery: Principal | ICD-10-CM | POA: Diagnosis present

## 2012-09-06 DIAGNOSIS — Z951 Presence of aortocoronary bypass graft: Secondary | ICD-10-CM

## 2012-09-06 DIAGNOSIS — M129 Arthropathy, unspecified: Secondary | ICD-10-CM | POA: Diagnosis present

## 2012-09-06 DIAGNOSIS — I120 Hypertensive chronic kidney disease with stage 5 chronic kidney disease or end stage renal disease: Secondary | ICD-10-CM | POA: Diagnosis present

## 2012-09-06 DIAGNOSIS — K219 Gastro-esophageal reflux disease without esophagitis: Secondary | ICD-10-CM | POA: Diagnosis present

## 2012-09-06 DIAGNOSIS — Z8673 Personal history of transient ischemic attack (TIA), and cerebral infarction without residual deficits: Secondary | ICD-10-CM

## 2012-09-06 DIAGNOSIS — F3289 Other specified depressive episodes: Secondary | ICD-10-CM | POA: Diagnosis present

## 2012-09-06 DIAGNOSIS — D631 Anemia in chronic kidney disease: Secondary | ICD-10-CM | POA: Diagnosis present

## 2012-09-06 DIAGNOSIS — I252 Old myocardial infarction: Secondary | ICD-10-CM

## 2012-09-06 HISTORY — DX: End stage renal disease: N18.6

## 2012-09-06 LAB — HEMOGLOBIN AND HEMATOCRIT, BLOOD: HCT: 29.2 % — ABNORMAL LOW (ref 39.0–52.0)

## 2012-09-06 MED ORDER — HEPARIN SODIUM (PORCINE) 5000 UNIT/ML IJ SOLN
5000.0000 [IU] | Freq: Three times a day (TID) | INTRAMUSCULAR | Status: DC
  Filled 2012-09-06 (×2): qty 1

## 2012-09-06 NOTE — Progress Notes (Signed)
Patient arrived at [facility] via EMS at approximately 7:15 pm.  Patient had one unit of PRBC transfusing on transfer.  EMS transporters stated IV pump stopped working at beginning of trip from [place] [facility] to [facility].  Patient received approximately 150cc of unit [id number].  Patient denies pain, alert and oriented x 4, skin intact.  Hemo cath in right upper chest, 2 peripheral iv's in right forearm, patent.  Family at bedside.  Dr. Julian Reil notified of patient arrival.  Patient then transferred to 4N21 for possible stroke workup.  Report called to [facility].  Patient transferred via bed to 4n.  Macarthur Critchley, RN

## 2012-09-06 NOTE — Consult Note (Signed)
Reason for Consult: Transient alteration of awareness Referring Physician: Lyda Perone  CC: Transient alteration of awareness  History is obtained from: Patient, family  HPI: Shane [name] is a 79 y.o. male with a history of end-stage renal disease, diabetes, hypertension, TIA who had an episode in church where he became unresponsive. He stood up and felt lightheaded, and then states of the next thing he knew he was sitting down again. His family states that he stood up and got a glazed look, then would not respond when they were speaking to him. They sat him down, and he came back to himself immediately. He did not have any confusion after the event. He does have a time where he does not remember, and does not remember people asking him questions. He does not have a history of similar episodes, or staring spells. He does occasionally become mildly confused, lost unfamiliar places.   Yesterday, he had 2 episodes of lightheadedness. He also notes new numbness in his right leg has been present for 2-3 days.   ROS: A 14 point ROS was performed and is negative except as noted in the HPI.  Past Medical History  Diagnosis Date  . Coronary artery disease   . Chronic kidney disease     on kidney transplant list  . Hypertension   . Diabetes mellitus   . Myocardial infarction     according to patient  . CVA (cerebral vascular accident)     2 mini strokes  . Shortness of breath     with exertion  . Depression   . Arthritis   . GERD (gastroesophageal reflux disease)   . H/O hiatal hernia   . ESRD (end stage renal disease)     Dialysis T/T/S    Family History: No history of strokes, brother had injury as a child and subsequent seizures.  Social History: Tob: Denies  Exam: Current vital signs: BP 152/60  Pulse 62  Temp 98.3 F (36.8 C) (Oral)  Resp 18  Ht 5\' 6"  (1.676 m)  SpO2 97% Vital signs in last 24 hours: Temp:  [98.3 F (36.8 C)] 98.3 F (36.8 C) (01/12 2100) Pulse  Rate:  [62] 62  (01/12 2100) Resp:  [18] 18  (01/12 2100) BP: (152)/(60) 152/60 mmHg (01/12 2100) SpO2:  [97 %] 97 % (01/12 2100)  General: In bed, no apparent distress CV: Regular in rhythm Mental Status: Patient is awake, alert, oriented to person, place, month, year, and situation. Immediate and remote memory are intact. Patient is able to give a clear and coherent history. No signs of aphasia Cranial Nerves: II: Visual Fields are full. Pupils are equal, round, and reactive to light.  Discs are difficult to visualize. III,IV, VI: EOMI without ptosis or diploplia.  V: Facial sensation is symmetric to temperature VII: Facial movement is symmetric.  VIII: hearing is intact to voice X: Uvula elevates symmetrically XI: Shoulder shrug is symmetric. XII: tongue is midline without atrophy or fasciculations.  Motor: Tone is normal. Bulk is normal. 5/5 strength was present in all four extremities.  Sensory: Sensation is symmetric to light touch and temperature in the arms and legs. With the exception of an area on the anterolateral aspect of his right thigh. Deep Tendon Reflexes: 2+ and symmetric in the biceps and patellae. Absent at the ankles Cerebellar: FNF with mild intention tremor bilaterally.  Gait: Patient has a stable casual gait.    I have reviewed labs in epic and the results pertinent to  this consultation are: HgB 9.8  I have reviewed the images obtained: None  Impression: 79 year old male with transient alteration awareness today. After discussion with his family, is not clear that he had any focal findings, though there was some question of facial droop. The right-sided numbness of which he complains is typically most consistent with meralgia paresthetica, however given the new-onset coupled with these events an MRI of the brain to rule out ischemia is needed.  Possibilities include presyncope, partial seizure, TIA, though the absence of memory would make TIA less  likely.   Recommendations: 1. HgbA1c, fasting lipid panel 2. MRI, MRA  of the brain without contrast 3. Echocardiogram 4. Carotid dopplers 5. Prophylactic therapy-Antiplatelet med: Plavix(Home med) - dose 75mg  7. Risk factor modification 8. Telemetry monitoring 9. Frequent neuro checks 10. EEG 11. Orthostatic vital signs     Ritta Slot, MD [facility] [phone]  If 7pm- 7am, please page neurology on call at [phone].
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
## 2012-09-07 ENCOUNTER — Inpatient Hospital Stay (HOSPITAL_COMMUNITY): Payer: Medicare Other

## 2012-09-07 DIAGNOSIS — R55 Syncope and collapse: Secondary | ICD-10-CM | POA: Diagnosis present

## 2012-09-07 LAB — CBC
MCH: 28.6 pg (ref 26.0–34.0)
MCHC: 32.7 g/dL (ref 30.0–36.0)
Platelets: 126 10*3/uL — ABNORMAL LOW (ref 150–400)
RBC: 3.01 MIL/uL — ABNORMAL LOW (ref 4.22–5.81)
RDW: 14.9 % (ref 11.5–15.5)

## 2012-09-07 LAB — COMPREHENSIVE METABOLIC PANEL
ALT: 11 U/L (ref 0–53)
AST: 13 U/L (ref 0–37)
Albumin: 2.7 g/dL — ABNORMAL LOW (ref 3.5–5.2)
Calcium: 9.6 mg/dL (ref 8.4–10.5)
Creatinine, Ser: 4.44 mg/dL — ABNORMAL HIGH (ref 0.50–1.35)
Sodium: 141 mEq/L (ref 135–145)
Total Protein: 6.3 g/dL (ref 6.0–8.3)

## 2012-09-07 LAB — GLUCOSE, CAPILLARY: Glucose-Capillary: 101 mg/dL — ABNORMAL HIGH (ref 70–99)

## 2012-09-07 LAB — LIPID PANEL
Cholesterol: 112 mg/dL (ref 0–200)
Total CHOL/HDL Ratio: 2.9 RATIO
Triglycerides: 140 mg/dL (ref <150)
VLDL: 28 mg/dL (ref 0–40)

## 2012-09-07 MED ORDER — ALLOPURINOL 100 MG PO TABS
100.0000 mg | ORAL_TABLET | Freq: Every day | ORAL | Status: DC
  Administered 2012-09-07 – 2012-09-09 (×2): 100 mg via ORAL
  Filled 2012-09-07 (×3): qty 1

## 2012-09-07 MED ORDER — ATORVASTATIN CALCIUM 40 MG PO TABS
40.0000 mg | ORAL_TABLET | Freq: Every day | ORAL | Status: DC
  Administered 2012-09-07 – 2012-09-09 (×2): 40 mg via ORAL
  Filled 2012-09-07 (×3): qty 1

## 2012-09-07 MED ORDER — FINASTERIDE 5 MG PO TABS
5.0000 mg | ORAL_TABLET | Freq: Every day | ORAL | Status: DC
  Administered 2012-09-07 – 2012-09-09 (×2): 5 mg via ORAL
  Filled 2012-09-07 (×3): qty 1

## 2012-09-07 MED ORDER — CLOPIDOGREL BISULFATE 75 MG PO TABS
75.0000 mg | ORAL_TABLET | Freq: Every day | ORAL | Status: DC
  Administered 2012-09-07 – 2012-09-09 (×2): 75 mg via ORAL
  Filled 2012-09-07 (×3): qty 1

## 2012-09-07 MED ORDER — INSULIN ASPART 100 UNIT/ML ~~LOC~~ SOLN
0.0000 [IU] | Freq: Three times a day (TID) | SUBCUTANEOUS | Status: DC
  Administered 2012-09-08 – 2012-09-09 (×2): 1 [IU] via SUBCUTANEOUS

## 2012-09-07 MED ORDER — FAMOTIDINE 20 MG PO TABS
20.0000 mg | ORAL_TABLET | Freq: Every day | ORAL | Status: DC
  Administered 2012-09-07 – 2012-09-08 (×2): 20 mg via ORAL
  Filled 2012-09-07 (×3): qty 1

## 2012-09-07 MED ORDER — MEMANTINE HCL 10 MG PO TABS
10.0000 mg | ORAL_TABLET | Freq: Every day | ORAL | Status: DC
  Administered 2012-09-07 – 2012-09-08 (×2): 10 mg via ORAL
  Filled 2012-09-07 (×3): qty 1

## 2012-09-07 MED ORDER — CITALOPRAM HYDROBROMIDE 40 MG PO TABS
40.0000 mg | ORAL_TABLET | Freq: Every day | ORAL | Status: DC
  Administered 2012-09-07 – 2012-09-08 (×2): 40 mg via ORAL
  Filled 2012-09-07 (×3): qty 1

## 2012-09-07 NOTE — Progress Notes (Signed)
Subjective:  Pt's daughter and wife in the room. Pt states he feels fine. No further episodes of weaness. Objective: Current vital signs: BP 169/71  Pulse 63  Temp 98.7 F (37.1 C) (Oral)  Resp 20  Ht 5\' 6"  (1.676 m)  Wt 77 kg (169 lb 12.1 oz)  BMI 27.40 kg/m2  SpO2 100% Vital signs in last 24 hours: Temp:  [97.8 F (36.6 C)-99 F (37.2 C)] 98.7 F (37.1 C) (01/13 1819) Pulse Rate:  [52-73] 63  (01/13 1819) Resp:  [18-20] 20  (01/13 1819) BP: (119-169)/(56-81) 169/71 mmHg (01/13 1819) SpO2:  [97 %-100 %] 100 % (01/13 1819) Weight:  [77 kg (169 lb 12.1 oz)] 77 kg (169 lb 12.1 oz) (01/12 2100)  Intake/Output from previous day:   Intake/Output this shift: Total I/O In: 480 [P.O.:480] Out: -  Nutritional status: Renal  Physical Exam  Neurologic Exam:  MENTAL STATUS: awake, alert, Language fluent Follows simple commands. Naming intact   CRANIAL NERVES: pupils equal and reactive to light, extraocular muscles intact, no nystagmus, facial sensation and strength symmetric, uvula midline, tongue midline MOTOR: normal bulk and tone, full strength in the BUE, BLE 5/5 throughout SENSORY: normal and symmetric to light touch  COORDINATION: finger-nose-finger with mild tremor bilaterally but o/w normal    Lab Results: Basic Metabolic Panel:  Lab 09/07/12 2130  NA 141  K 3.1*  CL 99  CO2 29  GLUCOSE 139*  BUN 25*  CREATININE 4.44*  CALCIUM 9.6  MG --  PHOS --    Liver Function Tests:  Lab 09/07/12 0905  AST 13  ALT 11  ALKPHOS 81  BILITOT 0.4  PROT 6.3  ALBUMIN 2.7*   No results found for this basename: LIPASE:5,AMYLASE:5 in the last 168 hours No results found for this basename: AMMONIA:3 in the last 168 hours  CBC:  Lab 09/07/12 0905 09/06/12 2126  WBC 4.9 --  NEUTROABS -- --  HGB 8.6* 9.8*  HCT 26.3* 29.2*  MCV 87.4 --  PLT 126* --    Cardiac Enzymes: No results found for this basename: CKTOTAL:5,CKMB:5,CKMBINDEX:5,TROPONINI:5 in the last 168  hours  Lipid Panel:  Lab 09/07/12 0905  CHOL 112  TRIG 140  HDL 38*  CHOLHDL 2.9  VLDL 28  LDLCALC 46    CBG:  Lab 09/07/12 1319 09/07/12 0621 09/06/12 2355  GLUCAP 101* 146* 162*    Microbiology: Results for orders placed during the hospital encounter of 01/15/12  SURGICAL PCR SCREEN     Status: Abnormal   Collection Time   01/15/12 10:23 AM      Component Value Range Status Comment   MRSA, PCR NEGATIVE  NEGATIVE Final    Staphylococcus aureus POSITIVE (*) NEGATIVE Final     Coagulation Studies: No results found for this basename: LABPROT:5,INR:5 in the last 72 hours  Imaging: Mr Brain Wo Contrast 09/07/12 IIMPRESSION:  No conclusive acute infarction. Question punctate acute infarction  to the left of midline at the mid brain level of the brainstem.  Mild age related atrophy and chronic small vessel disease.   Mr Maxine Glenn Head/brain Wo Cm 09/07/12  IMPRESSION:  Anterior circulation vessels show mild distal vessel atherosclerotic irregularity.  Advanced atherosclerotic disease throughout the posterior circulation vessels including bilateral distal vertebral stenoses and a markedly stenotic and irregular basilar artery.  Both posterior cerebral arteries receive most to their supply from the anterior circulation, but there is probably some minor contribution from the basilar tip.    2 D Echo -  Impressions: EF 50 -55% No cardiac source of emboli was indentified. Compared to the prior study, there has been no significant interval change.     Medications:  Prior to Admission:  Prescriptions prior to admission  Medication Sig Dispense Refill  . allopurinol (ZYLOPRIM) 100 MG tablet Take 100 mg by mouth daily.       Marland Kitchen atorvastatin (LIPITOR) 40 MG tablet Take 40 mg by mouth daily.      . carvedilol (COREG) 25 MG tablet Take 25 mg by mouth 2 (two) times daily with a meal.       . citalopram (CELEXA) 40 MG tablet Take 40 mg by mouth at bedtime.      . ergocalciferol  (VITAMIN D2) 50000 UNITS capsule Take 50,000 Units by mouth every 30 (thirty) days. Takes the 1st of the month      . finasteride (PROSCAR) 5 MG tablet Take 5 mg by mouth daily.       . furosemide (LASIX) 80 MG tablet Take 80 mg by mouth Twice daily.       Marland Kitchen NAMENDA 10 MG tablet Take 10 mg by mouth at bedtime.       Marland Kitchen NIFEdipine (PROCARDIA XL/ADALAT-CC) 90 MG 24 hr tablet Take 60 mg by mouth daily.       Marland Kitchen PLAVIX 75 MG tablet Take 75 mg by mouth daily.       . potassium chloride SA (K-DUR,KLOR-CON) 20 MEQ tablet Take 20 mEq by mouth 3 (three) times daily.       . ranitidine (ZANTAC) 300 MG tablet Take 300 mg by mouth At bedtime.         Assessment/Plan:  Abnormal MRI/MRA. The patient was on Plavix PTA. If symptoms continue could refer pt to Dr Corliss Skains for cerebral angiogram and possible vertebral - basilar PTA / stent. Pt to have dialysis tomorrow. Consider allowing higher blood pressure for better perfusion.  Delton See PA-C [facility] Pager [phone] 09/07/2012, 6:32 PM

## 2012-09-07 NOTE — Evaluation (Signed)
Physical Therapy Evaluation Patient Details Name: Shane [name] MRN: [id number] DOB: [birth year] Today's Date: 09/07/2012 Time: 4098-1191 PT Time Calculation (min): 24 min  PT Assessment / Plan / Recommendation Clinical Impression  Pt is a 79 yo male with questionable TIA however is functioning at baseline. Pt scored a 22 on DGI and is a minimal fall risk. Patient functioning at baseline and safe for d/c home with spouse. No further skilled PT needs at this time. PT signing off. Please re-consult if need in future.    PT Assessment  Patent does not need any further PT services    Follow Up Recommendations  No PT follow up    Does the patient have the potential to tolerate intense rehabilitation      Barriers to Discharge        Equipment Recommendations  None recommended by PT    Recommendations for Other Services     Frequency      Precautions / Restrictions Precautions Precautions: None Restrictions Weight Bearing Restrictions: No   Pertinent Vitals/Pain 0/10      Mobility  Bed Mobility Bed Mobility: Supine to Sit;Sit to Supine Supine to Sit: 7: Independent;HOB flat Sit to Supine: 7: Independent;HOB flat Details for Bed Mobility Assistance: safe and independent Transfers Transfers: Sit to Stand;Stand to Sit Sit to Stand: 7: Independent;With upper extremity assist;From bed Stand to Sit: 7: Independent;With upper extremity assist;With armrests;To chair/3-in-1 Details for Transfer Assistance: demo'd safe technique Ambulation/Gait Ambulation/Gait Assistance: 7: Independent Ambulation Distance (Feet): 300 Feet Assistive device: None Ambulation/Gait Assistance Details: performed DGI, pt amb at normal pace with normal gait pattern Gait Pattern: Within Functional Limits Gait velocity: normal General Gait Details: no episodes of LOB Stairs: Yes Stairs Assistance: 7: Independent Stair Management Technique: One rail Right Number of Stairs: 12  Wheelchair  Mobility Wheelchair Mobility: No Modified Rankin (Stroke Patients Only) Pre-Morbid Rankin Score: No symptoms Modified Rankin: No symptoms    Shoulder Instructions     Exercises     PT Diagnosis:    PT Problem List:   PT Treatment Interventions:     PT Goals Acute Rehab PT Goals PT Goal Formulation:  (n/a)  Visit Information  Last PT Received On: 09/07/12 Assistance Needed: +1    Subjective Data  Subjective: Pt received supine in bed agreeable to PT Patient Stated Goal: home   Prior Functioning  Home Living Lives With: Spouse Available Help at Discharge: Family;Available 24 hours/day Type of Home: House Home Access: Ramped entrance;Stairs to enter Entrance Stairs-Number of Steps: 3 Entrance Stairs-Rails: Can reach both Home Layout: One level Bathroom Shower/Tub: Engineer, manufacturing systems: Standard Bathroom Accessibility: Yes How Accessible: Accessible via walker Home Adaptive Equipment: None Prior Function Level of Independence: Independent Able to Take Stairs?: Yes Driving:  (not that much) Vocation: Retired Musician: H[employer] Dominant Hand: Right    Cognition  Overall Cognitive Status: Appears within functional limits for tasks assessed/performed Arousal/Alertness: Awake/alert Orientation Level: Oriented X4 / Intact Behavior During Session: WFL for tasks performed    Extremity/Trunk Assessment Right Upper Extremity Assessment RUE ROM/Strength/Tone: Within functional levels RUE Sensation: WFL - Light Touch Left Upper Extremity Assessment LUE ROM/Strength/Tone: Within functional levels LUE Sensation: WFL - Proprioception;WFL - Light Touch Right Lower Extremity Assessment RLE ROM/Strength/Tone: Within functional levels RLE Sensation: WFL - Proprioception Left Lower Extremity Assessment LLE ROM/Strength/Tone: Within functional levels LLE Sensation: WFL - Light Touch Trunk Assessment Trunk Assessment: Normal   Balance Standardized  Balance Assessment Standardized Balance Assessment: Dynamic Gait Index  Dynamic Gait Index Level Surface: Normal Change in Gait Speed: Normal Gait with Horizontal Head Turns: Normal Gait with Vertical Head Turns: Normal Gait and Pivot Turn: Normal Step Over Obstacle: Mild Impairment Step Around Obstacles: Normal Steps: Mild Impairment Total Score: 22   End of Session PT - End of Session Equipment Utilized During Treatment: Gait belt Activity Tolerance: Patient tolerated treatment well Patient left: in chair;with call bell/phone within reach;with family/visitor present Nurse Communication: Mobility status (patient cleared to ambulate in hallways with family)  GP     Marcene Brawn 09/07/2012, 3:50 PM  Lewis Shock, PT, DPT Pager #: [phone] Office #: [phone]

## 2012-09-07 NOTE — Progress Notes (Signed)
Bilateral:  No evidence of hemodynamically significant internal carotid artery stenosis.   Vertebral artery flow is antegrade.     

## 2012-09-07 NOTE — Progress Notes (Signed)
EEG completed.

## 2012-09-07 NOTE — Progress Notes (Signed)
[facility] PROGRESS NOTE  Shane [name] ZOX:[id number] DOB: 1931/03/02 DOA: 09/06/2012 PCP: [name]., MD  Assessment/Plan: 1. TIA versus punctate acute infarction brainstem: EEG pending. Resume Plavix 75 mg daily. Await final neurology recommendations and therapy evaluations. 2. End-stage renal disease: Tuesday Thursday Saturday. Message left for dialysis center. 3. Diabetes mellitus type II, diet controlled: Stable. 4. Anemia of chronic disease: No further transfusion.  Code Status: Full code Family Communication: Discussed with wife and daughter at bedside Disposition Plan: Likely home 1/14.  Brendia Sacks, MD  [facility] Team 4  Pager [phone] If 7PM-7AM, please contact night-coverage at [email or web address], password [facility] 09/07/2012, 4:30 PM  LOS: 1 day   Brief narrative: 79 y.o. male who presented to the hospital at [place] earlier today after 2 short episodes in the past 48 hours of what sounds suspicious for TIA. Specifically one episode occurred on Sat night, was associated with RLE weakness, ? R facial droop, and a "staring spell" that sounds suspicious for aphasia. A second episode occurred in church earlier today, during this episode there wasn't obvious sided weakness but again there was a "staring spell" that the patient states he was conscious during which sounds suspicious for aphasia.  The patient has a history of ESRD just started on HD in the last couple of weeks T/T/S, while he does become "weak" after dialysis, he and family note that it is much different from the symptoms he was experiencing in the past 24-48 hours. In the ED at [place], lab workup showed a HGB of 8.8, negative CT head, guiac negative stool on exam. They ordered 2 units for transfusion (1 actually partially transfused) and transferred the patient to [facility].  Consultants:  Neurology  Physical therapy: No follow  Procedures:  Carotid ultrasound: No evidence of internal carotid  artery stenosis. Vertebral artery flow antegrade.  2-D echocardiogram: grade 1 diastolic dysfunction. Left ventricular ejection fraction 50-55%.   HPI/Subjective: Afebrile, vital signs stable. No neurologic complaints. No symptoms.  Objective: Filed Vitals:   09/07/12 0620 09/07/12 0805 09/07/12 1015 09/07/12 1524  BP: 119/56 167/78 157/81 155/77  Pulse: 73 65 64 60  Temp:  97.8 F (36.6 C) 98.4 F (36.9 C) 98.4 F (36.9 C)  TempSrc:      Resp:  20 20 20   Height:      Weight:      SpO2:  100% 99% 100%    Intake/Output Summary (Last 24 hours) at 09/07/12 1630 Last data filed at 09/07/12 1526  Gross per 24 hour  Intake    240 ml  Output      0 ml  Net    240 ml   Filed Weights   09/06/12 2100  Weight: 77 kg (169 lb 12.1 oz)    Exam: General:  Appears calm and comfortable Eyes: Grossly normal pupils.  Cardiovascular: RRR, no m/r/g. No LE edema. Respiratory: CTA bilaterally, no w/r/r. Normal respiratory effort. Musculoskeletal: grossly normal tone and strength  BUE/BLE Psychiatric: grossly normal mood and affect, speech fluent and appropriate Neurologic: grossly non-focal.  Data Reviewed: Basic Metabolic Panel:  Lab 09/07/12 8295  NA 141  K 3.1*  CL 99  CO2 29  GLUCOSE 139*  BUN 25*  CREATININE 4.44*  CALCIUM 9.6  MG --  PHOS --   Liver Function Tests:  Lab 09/07/12 0905  AST 13  ALT 11  ALKPHOS 81  BILITOT 0.4  PROT 6.3  ALBUMIN 2.7*   CBC:  Lab 09/07/12 0905 09/06/12 2126  WBC 4.9 --  NEUTROABS -- --  HGB 8.6* 9.8*  HCT 26.3* 29.2*  MCV 87.4 --  PLT 126* --   CBG:  Lab 09/07/12 1319 09/07/12 0621 09/06/12 2355  GLUCAP 101* 146* 162*    Scheduled Meds:   Continuous Infusions:    Principal Problem:  *TIA (transient ischemic attack) Active Problems:  Coronary artery disease  Hypertension  End stage renal disease     Brendia Sacks, MD  [facility] Team 4  Pager [phone] If 7PM-7AM, please contact night-coverage  at [email or web address], password [facility] 09/07/2012, 4:30 PM  LOS: 1 day   Time spent:  20  minutes

## 2012-09-08 LAB — GLUCOSE, CAPILLARY
Glucose-Capillary: 133 mg/dL — ABNORMAL HIGH (ref 70–99)
Glucose-Capillary: 126 mg/dL — ABNORMAL HIGH (ref 70–99)

## 2012-09-08 LAB — BASIC METABOLIC PANEL
Chloride: 99 mEq/L (ref 96–112)
GFR calc Af Amer: 12 mL/min — ABNORMAL LOW (ref >90)
GFR calc non Af Amer: 11 mL/min — ABNORMAL LOW (ref >90)
Potassium: 2.7 mEq/L — CL (ref 3.5–5.1)
Sodium: 139 mEq/L (ref 135–145)

## 2012-09-08 MED ORDER — POTASSIUM CHLORIDE CRYS ER 20 MEQ PO TBCR
30.0000 meq | EXTENDED_RELEASE_TABLET | Freq: Once | ORAL | Status: AC
  Administered 2012-09-08: 30 meq via ORAL
  Filled 2012-09-08: qty 1

## 2012-09-08 MED ORDER — NEPRO/CARBSTEADY PO LIQD
237.0000 mL | Freq: Two times a day (BID) | ORAL | Status: DC
  Administered 2012-09-09 (×2): 237 mL via ORAL
  Filled 2012-09-08 (×4): qty 237

## 2012-09-08 MED ORDER — RENA-VITE PO TABS
1.0000 | ORAL_TABLET | Freq: Every day | ORAL | Status: DC
  Administered 2012-09-09: 1 via ORAL
  Filled 2012-09-08 (×2): qty 1

## 2012-09-08 MED ORDER — PARICALCITOL 5 MCG/ML IV SOLN: 4.0000 ug | INTRAVENOUS | Status: DC

## 2012-09-08 MED ORDER — DARBEPOETIN ALFA-POLYSORBATE 150 MCG/0.3ML IJ SOLN: 150.0000 ug | INTRAMUSCULAR | Status: DC

## 2012-09-08 MED ORDER — ASPIRIN 81 MG PO CHEW
81.0000 mg | CHEWABLE_TABLET | Freq: Every day | ORAL | Status: DC
  Administered 2012-09-09: 81 mg via ORAL
  Filled 2012-09-08: qty 1

## 2012-09-08 NOTE — Progress Notes (Signed)
Pre dialysis potassium 2.7. Patient on 4K bath, Will give 30 mEq of KDur po. Recheck potassium in the AM  Shane [name] B
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
## 2012-09-08 NOTE — Evaluation (Signed)
Speech Language Pathology Evaluation Patient Details Name: Shane [name] MRN: [id number] DOB: [birth year] Today's Date: 09/08/2012 Time: 8295-6213 SLP Time Calculation (min): 30 min  Problem List:  Patient Active Problem List  Diagnosis  . CHRONIC KIDNEY DISEASE UNSPECIFIED  . TIA  . Coronary artery disease  . Hypertension  . End stage renal disease  . TIA (transient ischemic attack)  . Syncope and collapse   Past Medical History:  Past Medical History  Diagnosis Date  . Coronary artery disease   . Chronic kidney disease     on kidney transplant list  . Hypertension   . Diabetes mellitus   . Myocardial infarction     according to patient  . CVA (cerebral vascular accident)     2 mini strokes  . Shortness of breath     with exertion  . Depression   . Arthritis   . GERD (gastroesophageal reflux disease)   . H/O hiatal hernia   . ESRD (end stage renal disease)     Dialysis T/T/S   Past Surgical History:  Past Surgical History  Procedure Date  . Dialysis fistula creation   . Coronary artery bypass graft 2002  . Av fistula placement 01/16/2012    Left BVT   . Venoplasty 04/06/2012    Left Basilic Vein Stenosis   HPI:  79 y.o. male who presented to the hospital at [place] earlier today after 2 short episodes in the past 48 hours of what sounds suspicious for TIA. Specifically one episode occurred on Sat night, was associated with RLE weakness, ? R facial droop, and a "staring spell" that sounds suspicious for aphasia. A second episode occurred in church earlier today, during this episode there wasn't obvious sided weakness but again there was a "staring spell" that the patient states he was conscious during which sounds suspicious for aphasia.   Assessment / Plan / Recommendation Clinical Impression  Overall pts cognitive linguistic function is WNL. Pt does demonstrate mild impairment with memory requiring multiple repetitions to store and recall new information in less  than 25% of trials. In over 75% of tasks pt was able to verbalize newly learned information accurately. Pts wife reports that this is not changed from baseline. SLP offered suggestions regarding safety precautions after discharge and therapeutic intervention for memory. No further SLP f/u needed at this time.     SLP Assessment  Patient does not need any further Speech Lanaguage Pathology Services    Follow Up Recommendations       Frequency and Duration        Pertinent Vitals/Pain NA   SLP Goals     SLP Evaluation Prior Functioning  Cognitive/Linguistic Baseline: Within functional limits Type of Home: House Lives With: Spouse Available Help at Discharge: Family;Available 24 hours/day Vocation: Retired   IT consultant  Overall Cognitive Status: Impaired Arousal/Alertness: Awake/alert Orientation Level: Oriented X4 Attention: Focused;Sustained;Selective;Alternating Focused Attention: Appears intact Sustained Attention: Appears intact Selective Attention: Appears intact Alternating Attention: Appears intact Memory: Impaired Memory Impairment: Storage deficit;Decreased recall of new information Awareness: Appears intact Problem Solving: Appears intact Executive Function: Reasoning;Sequencing;Organizing;Decision Making;Self Monitoring Reasoning: Appears intact Sequencing: Appears intact Organizing: Appears intact Decision Making: Appears intact Self Monitoring: Appears intact Safety/Judgment: Appears intact    Comprehension  Auditory Comprehension Overall Auditory Comprehension: Appears within functional limits for tasks assessed    Expression Verbal Expression Overall Verbal Expression: Appears within functional limits for tasks assessed   Oral / Motor Oral Motor/Sensory Function Overall Oral Motor/Sensory Function: Appears within  functional limits for tasks assessed Motor Speech Overall Motor Speech: Appears within functional limits for tasks assessed   GO      Shane [name], Riley Nearing 09/08/2012, 11:41 AM

## 2012-09-08 NOTE — Consult Note (Signed)
[facility] Renal Consultation Note  Indication for Consultation:  Management of ESRD/hemodialysis; anemia, hypertension/volume and secondary hyperparathyroidism  HPI: Shane [name] is a 79 y.o. male admitted with Syncope ("presented to the [facility] after 2 short episodes  of what sounded suspicious for TIA. Specifically one episode occurred on Sat night, was associated with RLE weakness, ? R facial droop, and a "staring spell".)In the ED at  [facility], lab workup showed a HGB of 8.8, negative CT head, guiac negative stool on exam. They ordered 2 units for transfusion (1 actually partially transfused) and transferred the patient to [facility].  Neurology consulted noting he was not  A tpa candidate sec,. To delay in arrival. Workup  With MRA of the Brain 09/07/12 showed = Advanced atherosclerotic disease throughout the posterior circulation vessels including bilateral distal vertebral stenoses and a markedly stenotic and irregular basilar artery.  And Cerebral Angiogram ordered for the am with Dr. Corliss Skains and possible PTA /stenting of Basilar vessel. Now in room with his wife  No further episodes in hospital. For hd today on schedule.          Noted he recently  started  Hemodialysis secondary to HTN/DM ( was followed by Dr. Caryn Section  Pre hd many years) during  Dec. 05 ,2013 - Dec 28 , 2013 admit to [facility].Trying to obtain papers from hd center. He started at Outpt. Center Dec. 31.2013  . Attempted to use his Left upper arm Basilic vein AVF one needle avf and his perm cath  Last hd, sat and Center was unsuccessful using the AVF.Dr. Edilia Bo placed Avf 01/16/12 , had veno plasty 03/2012 and 06/01/12/ with [facility] placing perm cath 08/18/12.      Past Medical History  Diagnosis Date  . Coronary artery disease   . Chronic kidney disease     on kidney transplant list  . Hypertension   . Diabetes mellitus   . Myocardial infarction     according to patient  . CVA (cerebral  vascular accident)     2 mini strokes  . Shortness of breath     with exertion  . Depression   . Arthritis   . GERD (gastroesophageal reflux disease)   . H/O hiatal hernia   . ESRD (end stage renal disease)     Dialysis T/T/S    Past Surgical History  Procedure Date  . Dialysis fistula creation   . Coronary artery bypass graft 2002  . Av fistula placement 01/16/2012    Left BVT   . Venoplasty 04/06/2012    Left Basilic Vein Stenosis      Family History  Problem Relation Age of Onset  . Diabetes    . Kidney disease    . Heart disease    . Stroke    Social = lives with wife in [place] area, 5 grown children not in home, Quit textile work ~~ 4 years ago and   reports that he has never smoked. He quit smokeless tobacco use about 3 years ago. His smokeless tobacco use included Chew. He reports that he does not drink alcohol or use illicit drugs.  No Known Allergies  Prior to Admission medications   Medication Sig Start Date End Date Taking? Authorizing Provider  allopurinol (ZYLOPRIM) 100 MG tablet Take 100 mg by mouth daily.  02/20/11  Yes Historical Provider, MD  atorvastatin (LIPITOR) 40 MG tablet Take 40 mg by mouth daily. 02/20/11  Yes Iran Ouch, MD  carvedilol (  COREG) 25 MG tablet Take 25 mg by mouth 2 (two) times daily with a meal.  02/20/11  Yes Historical Provider, MD  citalopram (CELEXA) 40 MG tablet Take 40 mg by mouth at bedtime.   Yes Historical Provider, MD  ergocalciferol (VITAMIN D2) 50000 UNITS capsule Take 50,000 Units by mouth every 30 (thirty) days. Takes the 1st of the month   Yes Historical Provider, MD  finasteride (PROSCAR) 5 MG tablet Take 5 mg by mouth daily.  02/15/11  Yes Historical Provider, MD  furosemide (LASIX) 80 MG tablet Take 80 mg by mouth Twice daily.  12/10/10  Yes Historical Provider, MD  NAMENDA 10 MG tablet Take 10 mg by mouth at bedtime.  02/20/11  Yes Historical Provider, MD  NIFEdipine (PROCARDIA XL/ADALAT-CC) 90 MG 24 hr tablet Take  60 mg by mouth daily.  01/16/11  Yes Historical Provider, MD  PLAVIX 75 MG tablet Take 75 mg by mouth daily.  12/19/10  Yes Historical Provider, MD  potassium chloride SA (K-DUR,KLOR-CON) 20 MEQ tablet Take 20 mEq by mouth 3 (three) times daily.    Yes Historical Provider, MD  ranitidine (ZANTAC) 300 MG tablet Take 300 mg by mouth At bedtime.  02/20/11  Yes Historical Provider, MD     Results for orders placed during the hospital encounter of 09/06/12 (from the past 48 hour(s))  HEMOGLOBIN AND HEMATOCRIT, BLOOD     Status: Abnormal   Collection Time   09/06/12  9:26 PM      Component Value Range Comment   Hemoglobin 9.8 (*) 13.0 - 17.0 g/dL    HCT 16.1 (*) 09.6 - 52.0 %   GLUCOSE, CAPILLARY     Status: Abnormal   Collection Time   09/06/12 11:55 PM      Component Value Range Comment   Glucose-Capillary 162 (*) 70 - 99 mg/dL    Comment 1 Documented in Chart      Comment 2 Notify RN     GLUCOSE, CAPILLARY     Status: Abnormal   Collection Time   09/07/12  6:21 AM      Component Value Range Comment   Glucose-Capillary 146 (*) 70 - 99 mg/dL    Comment 1 Documented in Chart      Comment 2 Notify RN     HEMOGLOBIN A1C     Status: Abnormal   Collection Time   09/07/12  9:05 AM      Component Value Range Comment   Hemoglobin A1C 5.9 (*) <5.7 %    Mean Plasma Glucose 123 (*) <117 mg/dL   CBC     Status: Abnormal   Collection Time   09/07/12  9:05 AM      Component Value Range Comment   WBC 4.9  4.0 - 10.5 K/uL    RBC 3.01 (*) 4.22 - 5.81 MIL/uL    Hemoglobin 8.6 (*) 13.0 - 17.0 g/dL    HCT 04.5 (*) 40.9 - 52.0 %    MCV 87.4  78.0 - 100.0 fL    MCH 28.6  26.0 - 34.0 pg    MCHC 32.7  30.0 - 36.0 g/dL    RDW 81.1  91.4 - 78.2 %    Platelets 126 (*) 150 - 400 K/uL   COMPREHENSIVE METABOLIC PANEL     Status: Abnormal   Collection Time   09/07/12  9:05 AM      Component Value Range Comment   Sodium 141  135 - 145 mEq/L  Potassium 3.1 (*) 3.5 - 5.1 mEq/L    Chloride 99  96 - 112 mEq/L      CO2 29  19 - 32 mEq/L    Glucose, Bld 139 (*) 70 - 99 mg/dL    BUN 25 (*) 6 - 23 mg/dL    Creatinine, Ser 1.47 (*) 0.50 - 1.35 mg/dL    Calcium 9.6  8.4 - 82.9 mg/dL    Total Protein 6.3  6.0 - 8.3 g/dL    Albumin 2.7 (*) 3.5 - 5.2 g/dL    AST 13  0 - 37 U/L    ALT 11  0 - 53 U/L    Alkaline Phosphatase 81  39 - 117 U/L    Total Bilirubin 0.4  0.3 - 1.2 mg/dL    GFR calc non Af Amer 11 (*) >90 mL/min    GFR calc Af Amer 13 (*) >90 mL/min   LIPID PANEL     Status: Abnormal   Collection Time   09/07/12  9:05 AM      Component Value Range Comment   Cholesterol 112  0 - 200 mg/dL    Triglycerides 562  <130 mg/dL    HDL 38 (*) >86 mg/dL    Total CHOL/HDL Ratio 2.9      VLDL 28  0 - 40 mg/dL    LDL Cholesterol 46  0 - 99 mg/dL   GLUCOSE, CAPILLARY     Status: Abnormal   Collection Time   09/07/12  1:19 PM      Component Value Range Comment   Glucose-Capillary 101 (*) 70 - 99 mg/dL    Comment 1 Notify RN      Comment 2 Documented in Chart     GLUCOSE, CAPILLARY     Status: Abnormal   Collection Time   09/07/12  6:13 PM      Component Value Range Comment   Glucose-Capillary 142 (*) 70 - 99 mg/dL    Comment 1 Notify RN      Comment 2 Documented in Chart     GLUCOSE, CAPILLARY     Status: Normal   Collection Time   09/07/12  9:07 PM      Component Value Range Comment   Glucose-Capillary 97  70 - 99 mg/dL   GLUCOSE, CAPILLARY     Status: Abnormal   Collection Time   09/08/12  6:35 AM      Component Value Range Comment   Glucose-Capillary 119 (*) 70 - 99 mg/dL    Comment 1 Notify RN      Comment 2 Documented in Chart     GLUCOSE, CAPILLARY     Status: Abnormal   Collection Time   09/08/12 11:10 AM      Component Value Range Comment   Glucose-Capillary 133 (*) 70 - 99 mg/dL      ROS:  As noted in HPI only positives   Physical Exam: Filed Vitals:   09/08/12 1038  BP: 150/77  Pulse: 71  Temp: 99 F (37.2 C)  Resp: 18     General:Alert, Adult BM , NAD  Apppropriate HEENT: [place] , MMM  Eyes: eomi Neck: supple, no jvd Heart: RRR, no rub or murmur Lungs:  CTA bilat. Abdomen: soft, nontender Extremities: Left pedal edema 1- 2+ Skin: no rash or overt ulceers Neuro: alert, OX3, moving all extremities Dialysis Access: right ij perm cath  And Left upper arm avf pos. Bruit , not developed   Dialysis Orders: Center:  [facility]  on TTS . EDW 76 HD Bath 2.0 K, 2.5 CA  Time  4 hr Heparin  4ml . Access  Right  ij perm cath / l  Basilic Vein transposition AVF 01/16/12 with angioplasty  03/2012/ and 05/2012  Dr. Edilia Bo BFR 350 DFR 800   hectoral 2 mcg IV/HD, Epogen 4000   Units IV/HD  Venofer  0  Other   Assessment/Plan 1. Syncope with Vertebro Basilar syncope   With  Severe Stenosis of Basilar Artery on  MRA of Brain= stroke team Eval in process and RX/ ? Arteriogram this admit.  2. ESRD -  TTS hd ( [facility] . Unit ) 3.1  Use 4  k bath / noted on k supplement monitor in hosp , recheckin am and pre hd today,  3. Hypertension/volume  - 155/77 bp /  1Kg over edw, try to keep SBP > 120 / will not need Lasix  Noted on his out pt. Listing , currently on no bp meds as listed from home= Coreg 25 mg bid and Procardia 90 mg  q day 4. Anemia  - Hgb= 8.6  Use Aranesp today 5. Metabolic bone disease -  9.6 CA  Corrected  10.7, vit d and  No binders listed fu phos 6. Nutrition - 2.7 Albumin High Protein renal  Diet. Carbohydrate mod.  Lenny Pastel, PA-C [facility] Beeper [phone] 09/08/2012, 12:50 PM   Patient seen and examined and reviewed with Lenny Pastel, P.A..  Agree with assessment and plan as above.  Vinson Moselle  MD [facility] [phone] pgr    [phone] cell 09/08/2012, 4:38 PM

## 2012-09-08 NOTE — Progress Notes (Signed)
Stroke Team Progress Note  HISTORY Shane [name] is a 79 y.o. male with a history of end-stage renal disease, diabetes, hypertension, TIA who had an episode in church where he became unresponsive. He stood up and felt lightheaded, and then states of the next thing he knew he was sitting down again. His family states that he stood up and got a glazed look, then would not respond when they were speaking to him. They sat him down, and he came back to himself immediately. He did not have any confusion after the event. He does have a time where he does not remember, and does not remember people asking him questions. He does not have a history of similar episodes, or staring spells. He does occasionally become mildly confused, lost unfamiliar places.  Yesterday 09/05/2012, he had 2 episodes of lightheadedness. He also notes new numbness in his right leg has been present for 2-3 days. Patient was not a TPA candidate secondary to delay in arrival. He was admitted for further evaluation and treatment.  SUBJECTIVE His wife and ST are at the bedside.  Overall he feels his condition is stable.   OBJECTIVE Most recent Vital Signs: Filed Vitals:   09/07/12 1819 09/07/12 2200 09/08/12 0200 09/08/12 0600  BP: 169/71 170/67 168/63 160/64  Pulse: 63 67 61 68  Temp: 98.7 F (37.1 C) 98.9 F (37.2 C) 98.7 F (37.1 C) 98.2 F (36.8 C)  TempSrc:  Oral Oral Oral  Resp: 20 20 20 20   Height:      Weight:      SpO2: 100% 100% 100% 100%   CBG (last 3)   Basename 09/08/12 0635 09/07/12 2107 09/07/12 1813  GLUCAP 119* 97 142*   IV Fluid Intake:     MEDICATIONS    . allopurinol  100 mg Oral Daily  . atorvastatin  40 mg Oral Daily  . citalopram  40 mg Oral QHS  . clopidogrel  75 mg Oral Daily  . famotidine  20 mg Oral QHS  . finasteride  5 mg Oral Daily  . insulin aspart  0-9 Units Subcutaneous TID WC  . memantine  10 mg Oral QHS   PRN:    Diet:  Renal thin liquids Activity:  OOB with assistance DVT  Prophylaxis:  SCDs   CLINICALLY SIGNIFICANT STUDIES Basic Metabolic Panel:  Lab 09/07/12 4098  NA 141  K 3.1*  CL 99  CO2 29  GLUCOSE 139*  BUN 25*  CREATININE 4.44*  CALCIUM 9.6  MG --  PHOS --   Liver Function Tests:  Lab 09/07/12 0905  AST 13  ALT 11  ALKPHOS 81  BILITOT 0.4  PROT 6.3  ALBUMIN 2.7*   CBC:  Lab 09/07/12 0905 09/06/12 2126  WBC 4.9 --  NEUTROABS -- --  HGB 8.6* 9.8*  HCT 26.3* 29.2*  MCV 87.4 --  PLT 126* --   Coagulation: No results found for this basename: LABPROT:4,INR:4 in the last 168 hours Cardiac Enzymes: No results found for this basename: CKTOTAL:3,CKMB:3,CKMBINDEX:3,TROPONINI:3 in the last 168 hours Urinalysis: No results found for this basename: COLORURINE:2,APPERANCEUR:2,LABSPEC:2,PHURINE:2,GLUCOSEU:2,HGBUR:2,BILIRUBINUR:2,KETONESUR:2,PROTEINUR:2,UROBILINOGEN:2,NITRITE:2,LEUKOCYTESUR:2 in the last 168 hours Lipid Panel    Component Value Date/Time   CHOL 112 09/07/2012 0905   TRIG 140 09/07/2012 0905   HDL 38* 09/07/2012 0905   CHOLHDL 2.9 09/07/2012 0905   VLDL 28 09/07/2012 0905   LDLCALC 46 09/07/2012 0905   HgbA1C  Lab Results  Component Value Date   HGBA1C 5.9* 09/07/2012   Urine Drug  Screen:   No results found for this basename: labopia, cocainscrnur, labbenz, amphetmu, thcu, labbarb    Alcohol Level: No results found for this basename: ETH:2 in the last 168 hours  CT of the brain    MRI of the brain  09/07/2012  No conclusive acute infarction.  Question punctate acute infarction to the left of midline at the mid brain level of the brainstem.  Mild age related atrophy and chronic small vessel disease.    MRA of the brain  09/07/2012   Anterior circulation vessels show mild distal vessel atherosclerotic irregularity.  Advanced atherosclerotic disease throughout the posterior circulation vessels including bilateral distal vertebral stenoses and a markedly stenotic and irregular basilar artery.  Both posterior cerebral arteries  receive most to their supply from the anterior circulation, but there is probably some minor contribution from the basilar tip.     2D Echocardiogram  EF 50-55% with no source of embolus. No significant change since last echo 9/11  Carotid Doppler    CXR    EKG  .   Therapy Recommendations   Physical Exam  Pleasant elderly african [place] male not in distress.dialysis fistula left forearm.Awake alert. Afebrile. Head is nontraumatic. Neck is supple without bruit. Hearing is normal. Cardiac exam no murmur or gallop. Lungs are clear to auscultation. Distal pulses are well felt.  Neurological Exam : Awake  Alert oriented x 3. Normal speech and language.eye movements full without nystagmus.fundi were not visualized. Vision acuity and fields appear normal.Face symmetric. Tongue midline. Normal strength, tone, reflexes and coordination. Normal sensation. Gait broad-based..  ASSESSMENT Mr. Shane [name] is a 79 y.o. male presenting with syncope upon standing. Imaging confirms no acute infarct. Patient with vertebro-basilar syncope due to severe basilar artery stenosis.  Work up underway; cerebral angiogram recommended. On clopidogrel 75 mg orally every day prior to admission. Now on clopidogrel 75 mg orally every day for secondary stroke prevention. Patient with resultant syncope with standing.  Hyperlipidemia, LDL 46, on lipitor 40 PTA, on lipitor 40 mg now, at goal LDL < 100 (< 70 for diabetics) Diabetes, HgbA1c 5.9 CAD - MI CKD, on transplant list Hypertension  hx TIA x 2  Hospital day # 2  TREATMENT/PLAN  Add aspirin 81 mg orally every day to Plavix 75 mg daily for secondary stroke prevention given severe basilar stenosis Ongoing aggressive risk factor control Stay hydrated, as much as allowed by renal Cerebral angio. Recommend doing as an IP. Wife is going to speak with husband and consider. It can be done as an OP if desired.  Dr. Pearlean Brownie discussed w/ pt, wife and Dr. Brien Few.  Annie Main, MSN, RN, ANVP-BC, ANP-BC, [facility] Pager: [phone] 09/08/2012 10:22 AM  I have personally obtained a history, examined the patient, evaluated imaging results, and formulated the assessment and plan of care. I agree with the above.  Delia Heady, MD Medical Director [facility] Pager: [phone] 09/08/2012 1:34 PM

## 2012-09-08 NOTE — Progress Notes (Signed)
Lab called with a critically low potassium level of 2.7 pre dialysis. Dr Eliott Nine notified and orders given to administer 30 meq of Kdur. Pt is also on a 4k bath during dialysis today.  Will continue to monitor.

## 2012-09-08 NOTE — Procedures (Signed)
EEG NUMBER:  [id number].  REFERRING PHYSICIAN:  Brendia Sacks, MD  INDICATION FOR STUDY:  The patient suffered an episode of loss of consciousness on standing while at church on September 06, 2012.  Study is being performed to rule out possible new onset seizure disorder, although episode was likely syncopal in nature.  DESCRIPTION:  This is a routine EEG recording during wakefulness and during brief sleep.  Predominant background activity during wakefulness consisted of 10 Hz symmetrical alpha rhythm, which attenuated well with eye opening.  Photic stimulation produced a symmetrical occipital driving response.  Hyperventilation produced a normal symmetrical slowing response.  During sleep, symmetrical vertex waves, sleep spindles, and K complexes were recorded.  No epileptiform discharges were recorded.  There were no areas of abnormal slowing.  INTERPRETATION:  This is a normal EEG recording during wakefulness and during light sleep.     Noel Christmas, MD    [id number] D:  09/07/2012 17:13:40  T:  09/08/2012 02:35:13  Job #:  [id number]

## 2012-09-08 NOTE — Evaluation (Signed)
Occupational Therapy Evaluation Patient Details Name: Shane [name] MRN: [id number] DOB: 01-Aug-1931 Today's Date: 09/08/2012 Time: 0865-7846 OT Time Calculation (min): 37 min  OT Assessment / Plan / Recommendation Clinical Impression  This 79 y.o. male admitted with an episode of decreased responsiveness and questionable TIA. . Brain MRI showed no conclusive acute infarction. Question punctate acute infarction to the left of midline at the mid brain level of the brainstem. MRA revealed advanced atherosclerotic disease throughout the posterior circulation vessels including bilateral distal vertebral stenoses and a markedly stenotic and irregular basilar artery.  Pt does demonstrate impaired static and dynamic saccades upon testing, and impaired gaze stability with head turns, however, this does not appear to affect him functionally.  Pt. was provided with HEP to perform 4x/day and demonstrated understanding.  No further OT needs identified    OT Assessment  Patient does not need any further OT services    Follow Up Recommendations  No OT follow up    Barriers to Discharge      Equipment Recommendations  None recommended by OT    Recommendations for Other Services    Frequency       Precautions / Restrictions Precautions Precautions: None Restrictions Weight Bearing Restrictions: No       ADL  Eating/Feeding: Independent Where Assessed - Eating/Feeding: Chair Grooming: Wash/dry hands;Wash/dry face;Teeth care;Shaving;Independent Where Assessed - Grooming: Unsupported standing Upper Body Bathing: Independent Where Assessed - Upper Body Bathing: Unsupported sitting;Unsupported sit to stand Lower Body Bathing: Independent Where Assessed - Lower Body Bathing: Supported sit to stand;Unsupported sit to stand Upper Body Dressing: Independent Where Assessed - Upper Body Dressing: Unsupported sitting Lower Body Dressing: Independent Where Assessed - Lower Body Dressing: Supported sit to  stand;Unsupported sit to stand Toilet Transfer: Community education officer Method: Sit to Barista: Comfort height toilet Toileting - Clothing Manipulation and Hygiene: Independent Where Assessed - Engineer, mining and Hygiene: Standing Tub/Shower Transfer: Simulated;Independent Tub/Shower Transfer Method: Ambulating Transfers/Ambulation Related to ADLs: ambulates with supervision ADL Comments: Pt is able to complete BADLs independently    OT Diagnosis:    OT Problem List:   OT Treatment Interventions:     OT Goals    Visit Information  Last OT Received On: 09/08/12 Assistance Needed: +1    Subjective Data  Subjective: "It's about normal"  re: reading Patient Stated Goal: To go home   Prior Functioning     Home Living Lives With: Spouse Available Help at Discharge: Family;Available 24 hours/day Type of Home: House Home Access: Ramped entrance;Stairs to enter Entrance Stairs-Number of Steps: 3 Entrance Stairs-Rails: Can reach both Home Layout: One level Bathroom Shower/Tub: Engineer, manufacturing systems: Standard Bathroom Accessibility: Yes How Accessible: Accessible via walker Home Adaptive Equipment: None Prior Function Level of Independence: Independent Able to Take Stairs?: Yes Driving: Yes (on occasion, short distance) Vocation: Retired Musician: [employer] Dominant Hand: Right         Vision/Perception Vision - Assessment Eye Alignment: Within Functional Limits Vision Assessment: Vision tested Ocular Range of Motion: Within Functional Limits Tracking/Visual Pursuits: Able to track stimulus in all quads without difficulty Saccades: Undershoots;Decreased speed of saccadic movement (to Lt) Visual Fields: No apparent deficits Additional Comments: Pt with significant difficulty with static saccades and dynamic saccades on Lt.   Pt. also with decreased stabilization on Lt. with head turns.  Pt read  newspaper making multiple errors.  Pt. reports this is his baseline and that he does not read well.  During functional  activities ambulating in hallway and reading signage with increased speed, no signs of difficulty noted.  Pt. provided with HEP to increase gaze stabilization and saccades, and they were minimally improved from onset of eval.  Instructed pt to perform 4x/day and he agreed Perception Perception: Within Functional Limits Praxis Praxis: Intact   Cognition  Overall Cognitive Status: Appears within functional limits for tasks assessed/performed Arousal/Alertness: Awake/alert Orientation Level: Oriented X4 / Intact Behavior During Session: [facility] for tasks performed    Extremity/Trunk Assessment Right Upper Extremity Assessment RUE ROM/Strength/Tone: Within functional levels RUE Sensation: WFL - Light Touch RUE Coordination: WFL - gross/fine motor Left Upper Extremity Assessment LUE ROM/Strength/Tone: Within functional levels LUE Sensation: WFL - Proprioception;WFL - Light Touch LUE Coordination: WFL - gross/fine motor Trunk Assessment Trunk Assessment: Normal     Mobility Transfers Transfers: Sit to Stand;Stand to Sit Sit to Stand: 7: Independent;With upper extremity assist;From bed Stand to Sit: 7: Independent;With upper extremity assist;With armrests;To chair/3-in-1     Shoulder Instructions     Exercise     Balance     End of Session OT - End of Session Activity Tolerance: Patient tolerated treatment well Patient left: in chair;with call bell/phone within reach;with family/visitor present  GO     Dyshaun Bonzo, Ursula Alert M 09/08/2012, 4:12 PM

## 2012-09-08 NOTE — Progress Notes (Signed)
INITIAL NUTRITION ASSESSMENT  DOCUMENTATION CODES Per approved criteria  -Not Applicable   INTERVENTION: Recommend change diet to Renal 80/90  Nepro Shake po BID, each supplement provides 425 kcal and 19 grams protein.  Renal diet education   NUTRITION DIAGNOSIS: Increased nutrient needs related to HD as evidenced by estimated needs.   Goal: Pt to meet >/= 90% of their estimated nutrition needs.   Monitor:  PO intake, weight, education needs  Reason for Assessment: Malnutrition Screening Tool   79 y.o. male  Admitting Dx: TIA (transient ischemic attack)  ASSESSMENT: Pt recently started on HD in 07/2012. Wife at bedside with a lot of diet questions. Reviewed diet with pt/wife. Pt reports weight loss but likely due to fluid when started HD. Pt is hungry and feels wife has not given him much food. Wife had a lot of confusion about diet restrictions.   Height: Ht Readings from Last 1 Encounters:  09/06/12 5\' 6"  (1.676 m)    Weight: Wt Readings from Last 1 Encounters:  09/06/12 169 lb 12.1 oz (77 kg)    Ideal Body Weight: 64.5 kg  % Ideal Body Weight: 119%  Wt Readings from Last 10 Encounters:  09/06/12 169 lb 12.1 oz (77 kg)  06/17/12 192 lb (87.091 kg)  06/01/12 184 lb (83.462 kg)  06/01/12 184 lb (83.462 kg)  05/06/12 185 lb 3.2 oz (84.006 kg)  04/06/12 185 lb (83.915 kg)  04/06/12 185 lb (83.915 kg)  03/25/12 185 lb (83.915 kg)  03/04/12 188 lb (85.276 kg)  01/15/12 188 lb (85.276 kg)    Usual Body Weight: unknown to pt  % Usual Body Weight: -  BMI:  Body mass index is 27.40 kg/(m^2). overweight  Estimated Nutritional Needs: Kcal: 2000-2200 Protein: 80-100 Fluid: >/= 1.2 L/day  Skin:  No issues noted  Diet Order:   Renal 70/80  EDUCATION NEEDS: -Education needs addressed   Intake/Output Summary (Last 24 hours) at 09/08/12 1644 Last data filed at 09/08/12 1300  Gross per 24 hour  Intake    720 ml  Output      0 ml  Net    720 ml     Last BM: PTA   Labs:   Lab 09/07/12 0905  NA 141  K 3.1*  CL 99  CO2 29  BUN 25*  CREATININE 4.44*  CALCIUM 9.6  MG --  PHOS --  GLUCOSE 139*    CBG (last 3)   Basename 09/08/12 1110 09/08/12 0635 09/07/12 2107  GLUCAP 133* 119* 97    Scheduled Meds:   . allopurinol  100 mg Oral Daily  . aspirin  81 mg Oral Daily  . atorvastatin  40 mg Oral Daily  . citalopram  40 mg Oral QHS  . clopidogrel  75 mg Oral Daily  . darbepoetin (ARANESP) injection - DIALYSIS  150 mcg Intravenous Q Tue-HD  . famotidine  20 mg Oral QHS  . finasteride  5 mg Oral Daily  . insulin aspart  0-9 Units Subcutaneous TID WC  . memantine  10 mg Oral QHS  . multivitamin  1 tablet Oral Daily  . paricalcitol  4 mcg Intravenous Q T,Th,Sa-HD    Continuous Infusions:   Past Medical History  Diagnosis Date  . Coronary artery disease   . Chronic kidney disease     on kidney transplant list  . Hypertension   . Diabetes mellitus   . Myocardial infarction     according to patient  . CVA (cerebral vascular  accident)     2 mini strokes  . Shortness of breath     with exertion  . Depression   . Arthritis   . GERD (gastroesophageal reflux disease)   . H/O hiatal hernia   . ESRD (end stage renal disease)     Dialysis T/T/S    Past Surgical History  Procedure Date  . Dialysis fistula creation   . Coronary artery bypass graft 2002  . Av fistula placement 01/16/2012    Left BVT   . Venoplasty 04/06/2012    Left Basilic Vein Stenosis    Kendell Bane RD, LDN, CNSC [phone] Pager [phone] After Hours Pager

## 2012-09-09 ENCOUNTER — Inpatient Hospital Stay (HOSPITAL_COMMUNITY): Payer: Medicare Other

## 2012-09-09 ENCOUNTER — Encounter (HOSPITAL_COMMUNITY): Payer: Self-pay

## 2012-09-09 DIAGNOSIS — M7989 Other specified soft tissue disorders: Secondary | ICD-10-CM

## 2012-09-09 LAB — RENAL FUNCTION PANEL
CO2: 29 mEq/L (ref 19–32)
Chloride: 102 mEq/L (ref 96–112)
GFR calc Af Amer: 18 mL/min — ABNORMAL LOW (ref >90)
Glucose, Bld: 139 mg/dL — ABNORMAL HIGH (ref 70–99)
Potassium: 3.6 mEq/L (ref 3.5–5.1)
Sodium: 141 mEq/L (ref 135–145)

## 2012-09-09 LAB — GLUCOSE, CAPILLARY
Glucose-Capillary: 114 mg/dL — ABNORMAL HIGH (ref 70–99)
Glucose-Capillary: 152 mg/dL — ABNORMAL HIGH (ref 70–99)

## 2012-09-09 LAB — CBC
Hemoglobin: 9.5 g/dL — ABNORMAL LOW (ref 13.0–17.0)
MCH: 28.9 pg (ref 26.0–34.0)
RBC: 3.29 MIL/uL — ABNORMAL LOW (ref 4.22–5.81)
WBC: 7.3 10*3/uL (ref 4.0–10.5)

## 2012-09-09 LAB — HEPATITIS B SURFACE ANTIGEN: Hepatitis B Surface Ag: NEGATIVE

## 2012-09-09 MED ORDER — ASPIRIN 81 MG PO CHEW: 81.0000 mg | CHEWABLE_TABLET | Freq: Every day | ORAL | Status: DC

## 2012-09-09 MED ORDER — IOHEXOL 350 MG/ML SOLN
50.0000 mL | Freq: Once | INTRAVENOUS | Status: AC | PRN
  Administered 2012-09-09: 50 mL via INTRAVENOUS

## 2012-09-09 MED ORDER — RENA-VITE PO TABS
1.0000 | ORAL_TABLET | Freq: Every day | ORAL | Status: DC
  Filled 2012-09-09: qty 1

## 2012-09-09 MED ORDER — RENA-VITE PO TABS: 1.0000 | ORAL_TABLET | Freq: Every day | ORAL | Status: AC

## 2012-09-09 MED ORDER — RENA-VITE PO TABS: 1.0000 | ORAL_TABLET | Freq: Every day | ORAL | Status: DC

## 2012-09-09 MED ORDER — NEPRO/CARBSTEADY PO LIQD: 237.0000 mL | Freq: Two times a day (BID) | ORAL | Status: DC
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
## 2012-09-09 NOTE — Progress Notes (Signed)
Stroke Team Progress Note  HISTORY Shane [name] is a 79 y.o. male with a history of end-stage renal disease, diabetes, hypertension, TIA who had an episode in church where he became unresponsive. He stood up and felt lightheaded, and then states of the next thing he knew he was sitting down again. His family states that he stood up and got a glazed look, then would not respond when they were speaking to him. They sat him down, and he came back to himself immediately. He did not have any confusion after the event. He does have a time where he does not remember, and does not remember people asking him questions. He does not have a history of similar episodes, or staring spells. He does occasionally become mildly confused, lost unfamiliar places.  Yesterday 09/05/2012, he had 2 episodes of lightheadedness. He also notes new numbness in his right leg has been present for 2-3 days. Patient was not a TPA candidate secondary to delay in arrival. He was admitted for further evaluation and treatment.  SUBJECTIVE His wife and family are at the bedside.  Overall he feels his condition is stable. He is just back from a CT angiogram.  OBJECTIVE Most recent Vital Signs: Filed Vitals:   09/08/12 2148 09/08/12 2305 09/09/12 0110 09/09/12 0536  BP: 136/89 150/70 131/59 146/67  Pulse: 71 71 69 71  Temp:  97.8 F (36.6 C) 99.7 F (37.6 C) 98.8 F (37.1 C)  TempSrc:  Oral Oral Oral  Resp: 20 18 20 20   Height:      Weight: 71.7 kg (158 lb 1.1 oz)   71.3 kg (157 lb 3 oz)  SpO2:  99% 100% 100%   CBG (last 3)   Basename 09/09/12 0658 09/08/12 2330 09/08/12 1651  GLUCAP 150* 152* 126*   IV Fluid Intake:     MEDICATIONS    . allopurinol  100 mg Oral Daily  . aspirin  81 mg Oral Daily  . atorvastatin  40 mg Oral Daily  . citalopram  40 mg Oral QHS  . clopidogrel  75 mg Oral Daily  . darbepoetin (ARANESP) injection - DIALYSIS  150 mcg Intravenous Q Tue-HD  . famotidine  20 mg Oral QHS  . feeding  supplement (NEPRO CARB STEADY)  237 mL Oral BID BM  . finasteride  5 mg Oral Daily  . insulin aspart  0-9 Units Subcutaneous TID WC  . memantine  10 mg Oral QHS  . multivitamin  1 tablet Oral Daily  . paricalcitol  4 mcg Intravenous Q T,Th,Sa-HD   PRN:    Diet:  Renal thin liquids Activity:  OOB with assistance DVT Prophylaxis:  SCDs   CLINICALLY SIGNIFICANT STUDIES Basic Metabolic Panel:   Lab 09/09/12 0750 09/08/12 1811  NA 141 139  K 3.6 2.7*  CL 102 99  CO2 29 30  GLUCOSE 139* 146*  BUN 19 34*  CREATININE 3.48* 4.77*  CALCIUM 9.4 9.3  MG -- --  PHOS 2.6 --   Liver Function Tests:   Lab 09/09/12 0750 09/07/12 0905  AST -- 13  ALT -- 11  ALKPHOS -- 81  BILITOT -- 0.4  PROT -- 6.3  ALBUMIN 2.9* 2.7*   CBC:   Lab 09/09/12 0750 09/07/12 0905  WBC 7.3 4.9  NEUTROABS -- --  HGB 9.5* 8.6*  HCT 28.4* 26.3*  MCV 86.3 87.4  PLT 145* 126*   Coagulation: No results found for this basename: LABPROT:4,INR:4 in the last 168 hours Cardiac Enzymes:  No results found for this basename: CKTOTAL:3,CKMB:3,CKMBINDEX:3,TROPONINI:3 in the last 168 hours Urinalysis: No results found for this basename: COLORURINE:2,APPERANCEUR:2,LABSPEC:2,PHURINE:2,GLUCOSEU:2,HGBUR:2,BILIRUBINUR:2,KETONESUR:2,PROTEINUR:2,UROBILINOGEN:2,NITRITE:2,LEUKOCYTESUR:2 in the last 168 hours Lipid Panel    Component Value Date/Time   CHOL 112 09/07/2012 0905   TRIG 140 09/07/2012 0905   HDL 38* 09/07/2012 0905   CHOLHDL 2.9 09/07/2012 0905   VLDL 28 09/07/2012 0905   LDLCALC 46 09/07/2012 0905   HgbA1C  Lab Results  Component Value Date   HGBA1C 5.9* 09/07/2012   Urine Drug Screen:   No results found for this basename: labopia,  cocainscrnur,  labbenz,  amphetmu,  thcu,  labbarb    Alcohol Level: No results found for this basename: ETH:2 in the last 168 hours  CT angio of the brain  Pre-read by Pearlean Brownie - 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  Question punctate acute infarction to the left of midline at the mid brain level of the brainstem.  Mild age related atrophy and chronic small vessel disease.    MRA of the brain  09/07/2012   Anterior circulation vessels show mild distal vessel atherosclerotic irregularity.  Advanced atherosclerotic disease throughout the posterior circulation vessels including bilateral distal vertebral stenoses and a markedly stenotic and irregular basilar artery.  Both posterior cerebral arteries receive most to their supply from the anterior circulation, but there is probably some minor contribution from the basilar tip.     2D Echocardiogram  EF 50-55% with no source of embolus. No significant change since last echo 9/11  Carotid Doppler  No evidence of hemodynamically significant internal carotid artery stenosis. Vertebral artery flow is antegrade.  LE Venous Doppler  Left: No evidence of DVT, superficial thrombosis, or Baker's cyst. Right: Negative for DVT in the common femoral vein.  Therapy Recommendations no PT of OT f/u recommended  Physical Exam   Pleasant awake elderly African American male currently not in distress. Head is not dramatic. Neck is supple without bruit. Cardiac exam soft ejection murmur. Lungs good auscultation. Dialysis AV fistula in th left arm Neurological Exam ; Awake  Alert oriented x 3. Normal speech and language.eye movements full without nystagmus. Face symmetric. Tongue midline. Normal strength, tone, reflexes and coordination. Normal sensation. Gait deferred.  ASSESSMENT Mr. Shane [name] is a 79 y.o. male presenting with syncope upon standing. Imaging confirms no acute infarct. Patient with vertebro-basilar syncope. 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  Work up completed. On clopidogrel 75 mg orally every day prior to admission. Now on clopidogrel 75 mg orally  every day for secondary stroke prevention. Patient with resultant syncope with standing that has now resolved.  Hyperlipidemia, LDL 46, on lipitor 40 PTA, on lipitor 40 mg now, at goal LDL < 100 (< 70 for diabetics) Diabetes, HgbA1c 5.9 CAD - MI CKD, on transplant list Hypertension  hx TIA x 2  Hospital day # 3  TREATMENT/PLAN  Add aspirin 81 mg orally every day to Plavix 75 mg daily for secondary stroke prevention given severe basilar stenosis x 2 months then plavix alone Ongoing aggressive risk factor control Stay hydrated, as much as allowed by renal Ok for discharge from neuro standpoint Stroke Service will sign off. Follow up with Dr. Pearlean Brownie, Stroke Clinic, in 2 months.  Dr. Pearlean Brownie discussed w/ pt, wife, family members and Dr. Brien Few.  SHARON BIBY, MSN, RN, ANVP-BC, ANP-BC, GNP-BC  [facility] Pager: [phone] 09/09/2012 10:59 AM  I have personally obtained a history, examined the patient, evaluated imaging results, and formulated the assessment and plan of care. I agree with the above.  Delia Heady, MD Medical Director [facility] Pager: [phone] 09/09/2012 10:59 AM

## 2012-09-09 NOTE — Progress Notes (Signed)
Left:  No evidence of DVT, superficial thrombosis, or Baker's cyst.  Right:  Negative for DVT in the common femoral vein.  

## 2012-09-09 NOTE — Progress Notes (Signed)
Subjective:  No cos, tolerated hd yesterday , for dc  Home today , noted  NO Stenting done with CT Angio of HEAD  , per  Notes told"Likely congenital and unlikely to affect management   " Objective Vital signs in last 24 hours: Filed Vitals:   09/08/12 2148 09/08/12 2305 09/09/12 0110 09/09/12 0536  BP: 136/89 150/70 131/59 146/67  Pulse: 71 71 69 71  Temp:  97.8 F (36.6 C) 99.7 F (37.6 C) 98.8 F (37.1 C)  TempSrc:  Oral Oral Oral  Resp: 20 18 20 20   Height:      Weight: 71.7 kg (158 lb 1.1 oz)   71.3 kg (157 lb 3 oz)  SpO2:  99% 100% 100%   Weight change:   Intake/Output Summary (Last 24 hours) at 09/09/12 1235 Last data filed at 09/08/12 2300  Gross per 24 hour  Intake    480 ml  Output   2214 ml  Net  -1734 ml   Labs: Basic Metabolic Panel:  Lab 09/09/12 1610 09/08/12 1811 09/07/12 0905  NA 141 139 141  K 3.6 2.7* 3.1*  CL 102 99 99  CO2 29 30 29   GLUCOSE 139* 146* 139*  BUN 19 34* 25*  CREATININE 3.48* 4.77* 4.44*  CALCIUM 9.4 9.3 9.6  ALB -- -- --  PHOS 2.6 -- --   Liver Function Tests:  Lab 09/09/12 0750 09/07/12 0905  AST -- 13  ALT -- 11  ALKPHOS -- 81  BILITOT -- 0.4  PROT -- 6.3  ALBUMIN 2.9* 2.7*   No results found for this basename: LIPASE:3,AMYLASE:3 in the last 168 hours No results found for this basename: AMMONIA:3 in the last 168 hours CBC:  Lab 09/09/12 0750 09/07/12 0905 09/06/12 2126  WBC 7.3 4.9 --  NEUTROABS -- -- --  HGB 9.5* 8.6* 9.8*  HCT 28.4* 26.3* 29.2*  MCV 86.3 87.4 --  PLT 145* 126* --   Cardiac Enzymes: No results found for this basename: CKTOTAL:5,CKMB:5,CKMBINDEX:5,TROPONINI:5 in the last 168 hours CBG:  Lab 09/09/12 1152 09/09/12 0658 09/08/12 2330 09/08/12 1651 09/08/12 1110  GLUCAP 114* 150* 152* 126* 133*    Iron Studies: No results found for this basename: IRON,TIBC,TRANSFERRIN,FERRITIN in the last 72 hours Studies/Results: Ct Angio Head W/cm &/or Wo Cm  09/09/2012  *RADIOLOGY REPORT*  Clinical Data:   79 year old male with left side weakness.  Admitted for CVA. Abnormal basilar artery on MRA.  CT ANGIOGRAPHY HEAD  Technique:  Multidetector CT imaging of the head was performed using the standard protocol during bolus administration of intravenous contrast.  Multiplanar CT image reconstructions including MIPs were obtained to evaluate the vascular anatomy.  Contrast: 50mL OMNIPAQUE IOHEXOL 350 MG/ML SOLN  Comparison:  Head MRI/MRA 09/07/2012.  Head CT 09/06/2012.  Findings:  No ventriculomegaly. No midline shift, mass effect, or evidence of mass lesion.  No acute intracranial hemorrhage identified.  Stable gray-white matter differentiation throughout the brain.  No abnormal enhancement identified.  No acute osseous abnormality identified.  Negative scalp and orbits soft tissues.  Vascular Findings: Major intracranial venous structures are enhancing.  Both distal vertebral arteries are patent but demonstrate moderate to severe calcified atherosclerosis and irregularity. Left PICA is patent and has its origin proximal to the most severe irregularity of the distal left vertebral artery.  High-grade focal stenosis of the distal left vertebral artery (radiographic string sign, series [id number] image 13) just proximal to the vertebrobasilar junction.  Tandem severe right distal vertebral artery stenosis (images six,  15) but not constituting a string sign to the same degree.  Despite these, the vertebrobasilar junction is patent.  Proximal basilar artery has a fairly normal caliber.  Both IAC origins are patent (both appear stenotic), but immediately beyond the AICA is the basilar artery is thread-like (image 25) and appears virtually occluded in its mid segment (image 28).  Bilateral fetal PCA origins.  The basilar tip probably is functionally reconstituted in the way.  Right SCA is patent and prominent.  Left SCA not identified.  Bilateral PCA branches are within normal limits.  Negative distal cervical ICA.  Both ICA  siphons are heavily calcified but without hemodynamically significant stenosis. Ophthalmic and posterior communicating artery origins are within normal limits.  Carotid termini are patent.  MCA and ACA origins are within normal limits.  Left ACA mildly dominant.  Anterior communicating artery within normal limits.  ACA branches within normal limits. Bilateral MCA M1 segments are within normal limits.  Bilateral MCA branches are within normal limits.   Review of the MIP images confirms the above findings.  IMPRESSION: 1.  Severe posterior circulation atherosclerosis with tandem severe distal vertebral artery and basilar artery stenoses.  The basilar artery is thread-like and functionally occluded in between the AICAs and right SCA origins.  Flow at the basilar tip probably reconstituted from the fetal PCAs/posterior communicating arteries. 2.  PCA branches within normal limits. 3.  Calcified and mildly ectatic but otherwise negative anterior circulation. 4.  Stable CT appearance of the brain.   Original Report Authenticated By: Erskine Speed, M.D.    Mr Brain Wo Contrast  09/07/2012  *RADIOLOGY REPORT*  Clinical Data:  TIA versus stroke.  Right lower extremity weakness and right facial droop.  Acute but ill-defined vascular disease.  MRI HEAD WITHOUT CONTRAST MRA HEAD WITHOUT CONTRAST  Technique:  Multiplanar, multiecho pulse sequences of the brain and surrounding structures were obtained without intravenous contrast. Angiographic images of the head were obtained using MRA technique without contrast.  Comparison:  Head CT 09/06/2012.  MRI 06/07/2009.  MRI HEAD  Findings:  Diffusion imaging does not show any definite acute or subacute infarction. I question a punctate acute infarction just to the left of midline at the mid brain level of the brainstem.  No cerebellar insult is identified.  The cerebral hemispheres show mild age related atrophy with mild chronic small vessel change of the white matter, less than often  seen in healthy individuals of this age.  There are a few old small vessel infarctions within the basal ganglia and thalami.  No cortical or large vessel territory infarction.  No mass lesion, hemorrhage, hydrocephalus or extra- axial collection.  No pituitary mass.  No inflammatory sinus disease.  IMPRESSION: No conclusive acute infarction.  Question punctate acute infarction to the left of midline at the mid brain level of the brainstem.  Mild age related atrophy and chronic small vessel disease.  MRA HEAD  Findings: Both internal carotid arteries are widely patent into the brain.  The anterior and middle cerebral vessels are patent without proximal stenosis, aneurysm or vascular malformation.  Both posterior cerebral arteries take a fetal origin from the anterior circulation.  More distal branch vessels show mild atherosclerotic irregularity.  There is considerable posterior circulation atherosclerotic disease.  Both vertebral arteries are irregular and there are focal stenoses in the distal vertebral arteries bilaterally.  The basilar artery appears narrowed and irregular.  Anterior inferior cerebellar arteries are patent bilaterally.  There is a patent posterior inferior  communicating artery on the left.  The distal basilar artery appears quite stenotic.  There may be a minimal contribution from the basilar tip to the posterior cerebral arteries.  IMPRESSION:  Anterior circulation vessels show mild distal vessel atherosclerotic irregularity.  Advanced atherosclerotic disease throughout the posterior circulation vessels including bilateral distal vertebral stenoses and a markedly stenotic and irregular basilar artery.  Both posterior cerebral arteries receive most to their supply from the anterior circulation, but there is probably some minor contribution from the basilar tip.   Original Report Authenticated By: Paulina Fusi, M.D.    Mr Mra Head/brain Wo Cm  09/07/2012  *RADIOLOGY REPORT*  Clinical Data:  TIA  versus stroke.  Right lower extremity weakness and right facial droop.  Acute but ill-defined vascular disease.  MRI HEAD WITHOUT CONTRAST MRA HEAD WITHOUT CONTRAST  Technique:  Multiplanar, multiecho pulse sequences of the brain and surrounding structures were obtained without intravenous contrast. Angiographic images of the head were obtained using MRA technique without contrast.  Comparison:  Head CT 09/06/2012.  MRI 06/07/2009.  MRI HEAD  Findings:  Diffusion imaging does not show any definite acute or subacute infarction. I question a punctate acute infarction just to the left of midline at the mid brain level of the brainstem.  No cerebellar insult is identified.  The cerebral hemispheres show mild age related atrophy with mild chronic small vessel change of the white matter, less than often seen in healthy individuals of this age.  There are a few old small vessel infarctions within the basal ganglia and thalami.  No cortical or large vessel territory infarction.  No mass lesion, hemorrhage, hydrocephalus or extra- axial collection.  No pituitary mass.  No inflammatory sinus disease.  IMPRESSION: No conclusive acute infarction.  Question punctate acute infarction to the left of midline at the mid brain level of the brainstem.  Mild age related atrophy and chronic small vessel disease.  MRA HEAD  Findings: Both internal carotid arteries are widely patent into the brain.  The anterior and middle cerebral vessels are patent without proximal stenosis, aneurysm or vascular malformation.  Both posterior cerebral arteries take a fetal origin from the anterior circulation.  More distal branch vessels show mild atherosclerotic irregularity.  There is considerable posterior circulation atherosclerotic disease.  Both vertebral arteries are irregular and there are focal stenoses in the distal vertebral arteries bilaterally.  The basilar artery appears narrowed and irregular.  Anterior inferior cerebellar arteries are  patent bilaterally.  There is a patent posterior inferior communicating artery on the left.  The distal basilar artery appears quite stenotic.  There may be a minimal contribution from the basilar tip to the posterior cerebral arteries.  IMPRESSION:  Anterior circulation vessels show mild distal vessel atherosclerotic irregularity.  Advanced atherosclerotic disease throughout the posterior circulation vessels including bilateral distal vertebral stenoses and a markedly stenotic and irregular basilar artery.  Both posterior cerebral arteries receive most to their supply from the anterior circulation, but there is probably some minor contribution from the basilar tip.   Original Report Authenticated By: Paulina Fusi, M.D.    Medications:      . allopurinol  100 mg Oral Daily  . aspirin  81 mg Oral Daily  . atorvastatin  40 mg Oral Daily  . citalopram  40 mg Oral QHS  . clopidogrel  75 mg Oral Daily  . darbepoetin (ARANESP) injection - DIALYSIS  150 mcg Intravenous Q Tue-HD  . famotidine  20 mg Oral QHS  . feeding  supplement (NEPRO CARB STEADY)  237 mL Oral BID BM  . finasteride  5 mg Oral Daily  . insulin aspart  0-9 Units Subcutaneous TID WC  . memantine  10 mg Oral QHS  . multivitamin  1 tablet Oral QHS  . paricalcitol  4 mcg Intravenous Q T,Th,Sa-HD   I  have reviewed scheduled and prn medications.  Physical Exam:  General: alert in chair , nad , appropriate Heart: RRR, no rub or murmur  Lungs: CTA bilat.  Abdomen: soft, nontender  Extremities: Left pedal edema trace Dialysis Access: right ij perm cath And Left upper arm avf pos. Bruit , not developed   Dialysis Orders: Center: [facility] on TTS .  EDW 76 HD Bath 2.0 K, 2.5 CA Time 4 hr Heparin 4ml . Access Right ij perm cath / l Basilic Vein transposition AVF 01/16/12 with angioplasty 03/2012/ and 05/2012 Dr. Edilia Bo BFR 350 DFR 800 hectoral 2 mcg IV/HD, Epogen 4000 Units IV/HD Venofer 0  Other   Assessment/Plan 1.Syncope with TIA and  Punctate Acute Brainstem  Infarction with bilateral distal vertebral stenoses =  For dc home and Stroke team Recommended= asa 81 mg qd and Plavix 75 mg q day for 2 months then Plavix alone/ fu Dr. Pearlean Brownie at Stroke Clinic 2 Months .2.ESRD - TTS hd ( [facility] . Unit )  Am k 3.6 yesterday 2.7 <  3.1 Used 4 k bath and 30 meq kdur by dr. Eliott Nine last night. / per pt. Off k supplement as op but was on 2.0 k bath as op center, recheck pre hd  At op center  and change to 3.0 k bath / Wil not need Lasix and K Supplement as op/   Noted he will see [facility] next week for avf per pt.  3.Hypertension/volume - stable bp with his  recent Syncopal episode   / 1Kg over edw, try to keep SBP > 120 / will not need Lasix Noted on his out pt. Listing , was  currently on no bp meds as listed from home= Coreg 25 mg bid and Procardia 90 mg q day/ will monitor bp at hd at op hd tts and if needed restart < BELOW his edw . Yesterday 71.7 kg will make edw 72 kg .4.Anemia - Hgb= 8.6> 9.5  This am/ Use Aranesp on hd / increase epo to 7000 u q hd 5.Metabolic bone disease - 9.4 CA Corrected 10.4, vit d and No binders listed fu phos 6.Marland KitchenMarland KitchenNutrition - 2.9 Albumin  Lenny Pastel, PA-C [facility] Beeper [phone] 09/09/2012,12:35 PM  LOS: 3 days   Patient seen and examined and reviewed with Lenny Pastel, P.A..  Agree with assessment and plan as above. Vinson Moselle  MD [facility] [phone] pgr    [phone] cell 09/09/2012, 1:51 PM

## 2012-09-18 ENCOUNTER — Encounter: Payer: Self-pay | Admitting: Surgery

## 2012-09-21 ENCOUNTER — Encounter: Payer: Self-pay | Admitting: Surgery

## 2012-09-21 ENCOUNTER — Encounter (INDEPENDENT_AMBULATORY_CARE_PROVIDER_SITE_OTHER): Payer: Medicare Other | Admitting: *Deleted

## 2012-09-21 ENCOUNTER — Ambulatory Visit (INDEPENDENT_AMBULATORY_CARE_PROVIDER_SITE_OTHER): Payer: Medicare Other | Admitting: Surgery

## 2012-09-21 VITALS — BP 137/74 | HR 78 | Ht 66.0 in | Wt 164.0 lb

## 2012-09-21 DIAGNOSIS — N185 Chronic kidney disease, stage 5: Secondary | ICD-10-CM

## 2012-09-21 DIAGNOSIS — N186 End stage renal disease: Secondary | ICD-10-CM

## 2012-09-21 DIAGNOSIS — T82898A Other specified complication of vascular prosthetic devices, implants and grafts, initial encounter: Secondary | ICD-10-CM

## 2012-09-21 NOTE — Progress Notes (Signed)
[facility] [place] HISTORY AND PHYSICAL   CC:  Non maturing fisutla Redding, Macy F. II, MD  HPI: This is a 79 y.o. male here for non maturing left brachiobasilic AVF placed 01/16/12 and venoplasty of the left basilic vein stenosis on 04/06/12.  He recently had a stroke/TIA that was associated with RLE weakness, facial droop and a "staring spell" that sounded suspicious for aphasia.  He had a 2nd episode 09/06/12 and again had the staring spell.   He comes to us today for evaluation of a non maturing AVF.  He has had a venoplasty on 04/06/12, but this has continued not to mature.    Past Medical History  Diagnosis Date  . Coronary artery disease   . Chronic kidney disease     on kidney transplant list  . Hypertension   . Diabetes mellitus   . Myocardial infarction     according to patient  . CVA (cerebral vascular accident)     2 mini strokes  . Shortness of breath     with exertion  . Depression   . Arthritis   . GERD (gastroesophageal reflux disease)   . H/O hiatal hernia   . ESRD (end stage renal disease)     Dialysis T/T/S   Past Surgical History  Procedure Date  . Dialysis fistula creation   . Coronary artery bypass graft 2002  . Av fistula placement 01/16/2012    Left BVT   . Venoplasty 04/06/2012    Left Basilic Vein Stenosis    No Known Allergies  Current Outpatient Prescriptions  Medication Sig Dispense Refill  . allopurinol (ZYLOPRIM) 100 MG tablet Take 100 mg by mouth daily.       . aspirin 81 MG chewable tablet Chew 1 tablet (81 mg total) by mouth daily.  90 tablet  0  . atorvastatin (LIPITOR) 40 MG tablet Take 40 mg by mouth daily.      . citalopram (CELEXA) 40 MG tablet Take 40 mg by mouth at bedtime.      . ergocalciferol (VITAMIN D2) 50000 UNITS capsule Take 50,000 Units by mouth every 30 (thirty) days. Takes the 1st of the month      . finasteride (PROSCAR) 5 MG tablet Take 5 mg by mouth daily.       . multivitamin (RENA-VIT) TABS  tablet Take 1 tablet by mouth daily.  30 tablet  0  . NAMENDA 10 MG tablet Take 10 mg by mouth at bedtime.       . Nutritional Supplements (FEEDING SUPPLEMENT, NEPRO CARB STEADY,) LIQD Take 237 mLs by mouth 2 (two) times daily between meals.  60 Can  0  . PLAVIX 75 MG tablet Take 75 mg by mouth daily.       . ranitidine (ZANTAC) 300 MG tablet Take 300 mg by mouth At bedtime.         Family History  Problem Relation Age of Onset  . Diabetes    . Kidney disease    . Heart disease    . Stroke      History   Social History  . Marital Status: Married    Spouse Name: N/A    Number of Children: 5  . Years of Education: N/A   Occupational History  . retired    Social History Main Topics  . Smoking status: Never Smoker   . Smokeless tobacco: Former User    Types: Chew    Quit date: 08/26/2009  .   Alcohol Use: No  . Drug Use: No  . Sexually Active: Not on file   Other Topics Concern  . Not on file   Social History Narrative  . No narrative on file     ROS: [x] Positive   [ ] Negative   [ ] All sytems reviewed and are negative  Cardiovascular: []chest pain; []chest pressure; []palpitations; []SOB lying flat; []DOE; []pain in legs with walking; []pain in legs when lying flat; []Hx of DVT; []Hx phlebitis; [x]swelling in legs; []varicose veins Pulmonary: []productive cough; []asthma; []wheezing Neurologic:  []Hx CVA;  []weakness in arms or legs; []numbness in arms or legs; []difficulty in speaking or slurred speech; []temporary loss of vision in one eye; []dizziness Hematologic:  []bleeding problems; []clots easily GI:  []vomiting blood; [] blood in stool; []PUD GU: [] Dysuria; []hematuria Psychiatric:  [x]Hx major depression Integumentary:  []rashes; []ulcers Constitutional:  []fever; []chills    PHYSICAL EXAMINATION:  Filed Vitals:   09/21/12 1529  BP: 137/74  Pulse: 78   Body mass index is 26.47 kg/(m^2).  General:  WDWN in NAD Gait: Normal HENT: WNL Eyes:  PERRL Pulmonary: normal non-labored breathing , without Rales, rhonchi,  wheezing Cardiac: RRR, without  Murmurs, rubs or gallops; No carotid bruits Abdomen: soft, NT, no masses Skin: no rashes, ulcers noted Vascular Exam/Pulses: + left radial palpable pulse.  There is a thrill in the left BVT; Extremities without ischemic changes, no Gangrene , no cellulitis; no open wounds;  Musculoskeletal: no muscle wasting or atrophy  Neurologic: A&O X 3; Appropriate Affect ; SENSATION: normal; MOTOR FUNCTION:  moving all extremities equally. Speech is fluent/normal  Non-Invasive Vascular Imaging:  Dialysis fistula duplex evalulation 09/21/12:  -antegrade, triphasic flow noted in the left radial and brachial arteries. -bidirectional flow noted in th partially occlusive mid to distal brachium level outflow vein -no flow is noted within the proximal brachium level outflow vein  1.  The left BVT AVF outflow vein demonstrates partial and total occlusions 2.  The previous exam on 05/06/12 demonstrated multiple areas of hemodynamically significant increased in velocities within the brachium level outflow vein.   ASSESSMENT/PLAN: 79 y.o. male with a non maturing BVT on the left who has recently had a CVA/TIA   -given that the BVT had a tight stenosis and looking back at the fistulogram, it does not look as if an intervention will be successful, he will need another access. --discussed with pt options of Left arm graft vs new fistula on the right. -his previous vein mapping was reviewed and his veins were about the same bilaterally.  He previously had a failed left radio cephalic AVF. -given that he did have some right sided weakness with his TIA/CVA and his veins were about the same on previous vein mapping, we will avoid the right arm at this time. -will with put in a left forearm vs. Left upper arm graft pending intraoperative ultrasound.   Reve Crocket, PA-C [facility] [phone]  Clinic MD:  [facility]   I agree with the above. The patient has had 2 failed attempts at fistula placement on the left. He is now on dialysis as of the past month via a right-sided catheter. Vein mapping showed that he is right venous system as well as the left were very similar. He has recently had a stroke which has affected his right side. After a well-informed discussion, we have decided to proceed with left forearm versus upper arm Gore-Tex graft. I told him   I would evaluate his outflow vein in the operating room and place either a forearm or upper arm graft. He will stop his Plavix 5 days prior to his operation.  Wells Brabham 

## 2012-09-24 ENCOUNTER — Other Ambulatory Visit: Payer: Self-pay | Admitting: *Deleted

## 2012-09-25 ENCOUNTER — Encounter (HOSPITAL_COMMUNITY): Payer: Self-pay | Admitting: Respiratory Therapy

## 2012-09-30 ENCOUNTER — Encounter (HOSPITAL_COMMUNITY): Payer: Self-pay | Admitting: Surgery

## 2012-09-30 NOTE — Progress Notes (Signed)
Faxed req to [facility] for all heart tests during hospitalization 07/2012.

## 2012-09-30 NOTE — Progress Notes (Signed)
09/30/12 1549  OBSTRUCTIVE SLEEP APNEA  Have you ever been diagnosed with sleep apnea through a sleep study? No  Do you snore loudly (loud enough to be heard through closed doors)?  0  Do you often feel tired, fatigued, or sleepy during the daytime? 1  Has anyone observed you stop breathing during your sleep? 0  Do you have, or are you being treated for high blood pressure? 1  BMI more than 35 kg/m2? 0  Age over 77 years old? 1  Gender: 1  Obstructive Sleep Apnea Score 4   Score 4 or greater  Results sent to PCP

## 2012-10-01 MED ORDER — DEXTROSE 5 % IV SOLN
1.5000 g | INTRAVENOUS | Status: AC
  Administered 2012-10-02: 1.5 g via INTRAVENOUS
  Filled 2012-10-01 (×2): qty 1.5

## 2012-10-02 ENCOUNTER — Encounter (HOSPITAL_COMMUNITY)
Admission: RE | Disposition: A | Discharge status: Home / Self Care | Payer: Self-pay | Source: Ambulatory Visit | Attending: Surgery

## 2012-10-02 ENCOUNTER — Ambulatory Visit (HOSPITAL_COMMUNITY): Payer: Medicare Other | Admitting: Anesthesiology

## 2012-10-02 ENCOUNTER — Ambulatory Visit (HOSPITAL_COMMUNITY)
Admission: RE | Admit: 2012-10-02 | Discharge: 2012-10-02 | Disposition: A | Discharge status: Home / Self Care | Payer: Medicare Other | Source: Ambulatory Visit | Attending: Surgery | Admitting: Surgery

## 2012-10-02 ENCOUNTER — Ambulatory Visit (HOSPITAL_COMMUNITY): Payer: Medicare Other

## 2012-10-02 ENCOUNTER — Encounter (HOSPITAL_COMMUNITY): Payer: Self-pay | Admitting: Anesthesiology

## 2012-10-02 ENCOUNTER — Encounter (HOSPITAL_COMMUNITY): Payer: Self-pay | Admitting: *Deleted

## 2012-10-02 DIAGNOSIS — N186 End stage renal disease: Secondary | ICD-10-CM

## 2012-10-02 DIAGNOSIS — Z992 Dependence on renal dialysis: Secondary | ICD-10-CM | POA: Insufficient documentation

## 2012-10-02 DIAGNOSIS — I120 Hypertensive chronic kidney disease with stage 5 chronic kidney disease or end stage renal disease: Secondary | ICD-10-CM | POA: Insufficient documentation

## 2012-10-02 DIAGNOSIS — E119 Type 2 diabetes mellitus without complications: Secondary | ICD-10-CM | POA: Insufficient documentation

## 2012-10-02 DIAGNOSIS — Z8673 Personal history of transient ischemic attack (TIA), and cerebral infarction without residual deficits: Secondary | ICD-10-CM | POA: Insufficient documentation

## 2012-10-02 HISTORY — PX: AV FISTULA PLACEMENT: SHX1204

## 2012-10-02 HISTORY — DX: Pneumonia, unspecified organism: J18.9

## 2012-10-02 LAB — POCT I-STAT 4, (NA,K, GLUC, HGB,HCT)
Glucose, Bld: 134 mg/dL — ABNORMAL HIGH (ref 70–99)
HCT: 31 % — ABNORMAL LOW (ref 39.0–52.0)
Sodium: 141 mEq/L (ref 135–145)

## 2012-10-02 LAB — GLUCOSE, CAPILLARY: Glucose-Capillary: 129 mg/dL — ABNORMAL HIGH (ref 70–99)

## 2012-10-02 LAB — SURGICAL PCR SCREEN
MRSA, PCR: NEGATIVE
Staphylococcus aureus: NEGATIVE

## 2012-10-02 SURGERY — INSERTION OF ARTERIOVENOUS (AV) GORE-TEX GRAFT ARM
Anesthesia: Monitor Anesthesia Care | Site: Arm Lower | Laterality: Left | Wound class: Clean

## 2012-10-02 MED ORDER — MIDAZOLAM HCL 5 MG/5ML IJ SOLN
INTRAMUSCULAR | Status: DC | PRN
  Administered 2012-10-02: 2 mg via INTRAVENOUS

## 2012-10-02 MED ORDER — HEPARIN SODIUM (PORCINE) 1000 UNIT/ML IJ SOLN
INTRAMUSCULAR | Status: DC | PRN
  Administered 2012-10-02: 6000 [IU] via INTRAVENOUS

## 2012-10-02 MED ORDER — ONDANSETRON HCL 4 MG/2ML IJ SOLN: 4.0000 mg | Freq: Once | INTRAMUSCULAR | Status: DC | PRN

## 2012-10-02 MED ORDER — OXYCODONE HCL 5 MG PO TABS: 5.0000 mg | ORAL_TABLET | ORAL | Status: DC | PRN

## 2012-10-02 MED ORDER — LIDOCAINE-EPINEPHRINE (PF) 1 %-1:200000 IJ SOLN
INTRAMUSCULAR | Status: DC | PRN
  Administered 2012-10-02: 24 mL

## 2012-10-02 MED ORDER — LIDOCAINE HCL (PF) 1 % IJ SOLN
INTRAMUSCULAR | Status: AC
  Filled 2012-10-02: qty 30

## 2012-10-02 MED ORDER — 0.9 % SODIUM CHLORIDE (POUR BTL) OPTIME
TOPICAL | Status: DC | PRN
  Administered 2012-10-02: 1000 mL

## 2012-10-02 MED ORDER — SODIUM CHLORIDE 0.9 % IV SOLN: INTRAVENOUS | Status: DC

## 2012-10-02 MED ORDER — PROPOFOL INFUSION 10 MG/ML OPTIME
INTRAVENOUS | Status: DC | PRN
  Administered 2012-10-02: 50 ug/kg/min via INTRAVENOUS

## 2012-10-02 MED ORDER — MUPIROCIN 2 % EX OINT
TOPICAL_OINTMENT | Freq: Once | CUTANEOUS | Status: DC
  Filled 2012-10-02: qty 22

## 2012-10-02 MED ORDER — LIDOCAINE-EPINEPHRINE (PF) 1 %-1:200000 IJ SOLN
INTRAMUSCULAR | Status: AC
  Filled 2012-10-02: qty 10

## 2012-10-02 MED ORDER — SODIUM CHLORIDE 0.9 % IV SOLN
INTRAVENOUS | Status: DC | PRN
  Administered 2012-10-02: 07:00:00 via INTRAVENOUS

## 2012-10-02 MED ORDER — PROTAMINE SULFATE 10 MG/ML IV SOLN
INTRAVENOUS | Status: DC | PRN
  Administered 2012-10-02: 30 mg via INTRAVENOUS

## 2012-10-02 MED ORDER — SODIUM CHLORIDE 0.9 % IR SOLN
Status: DC | PRN
  Administered 2012-10-02: 08:00:00

## 2012-10-02 MED ORDER — PROPOFOL 10 MG/ML IV BOLUS
INTRAVENOUS | Status: DC | PRN
  Administered 2012-10-02: 30 mg via INTRAVENOUS

## 2012-10-02 MED ORDER — HYDROMORPHONE HCL PF 1 MG/ML IJ SOLN: 0.2500 mg | INTRAMUSCULAR | Status: DC | PRN

## 2012-10-02 SURGICAL SUPPLY — 40 items
CANISTER SUCTION 2500CC (MISCELLANEOUS) ×2 IMPLANT
CLIP TI MEDIUM 6 (CLIP) ×2 IMPLANT
CLIP TI WIDE RED SMALL 6 (CLIP) ×2 IMPLANT
CLOTH BEACON ORANGE TIMEOUT ST (SAFETY) ×2 IMPLANT
COVER SURGICAL LIGHT HANDLE (MISCELLANEOUS) ×2 IMPLANT
DECANTER SPIKE VIAL GLASS SM (MISCELLANEOUS) ×2 IMPLANT
DERMABOND ADVANCED (GAUZE/BANDAGES/DRESSINGS) ×1
DERMABOND ADVANCED .7 DNX12 (GAUZE/BANDAGES/DRESSINGS) ×1 IMPLANT
ELECT REM PT RETURN 9FT ADLT (ELECTROSURGICAL) ×2
ELECTRODE REM PT RTRN 9FT ADLT (ELECTROSURGICAL) ×1 IMPLANT
GLOVE BIO SURGEON STRL SZ7.5 (GLOVE) ×2 IMPLANT
GLOVE BIOGEL PI IND STRL 6.5 (GLOVE) ×2 IMPLANT
GLOVE BIOGEL PI IND STRL 7.0 (GLOVE) ×1 IMPLANT
GLOVE BIOGEL PI IND STRL 7.5 (GLOVE) ×4 IMPLANT
GLOVE BIOGEL PI IND STRL 8 (GLOVE) ×1 IMPLANT
GLOVE BIOGEL PI INDICATOR 6.5 (GLOVE) ×2
GLOVE BIOGEL PI INDICATOR 7.0 (GLOVE) ×1
GLOVE BIOGEL PI INDICATOR 7.5 (GLOVE) ×4
GLOVE BIOGEL PI INDICATOR 8 (GLOVE) ×1
GLOVE ECLIPSE 6.5 STRL STRAW (GLOVE) ×4 IMPLANT
GLOVE ECLIPSE 7.0 STRL STRAW (GLOVE) ×2 IMPLANT
GLOVE SS BIOGEL STRL SZ 7 (GLOVE) ×1 IMPLANT
GLOVE SUPERSENSE BIOGEL SZ 7 (GLOVE) ×1
GLOVE SURG SS PI 7.5 STRL IVOR (GLOVE) ×2 IMPLANT
GOWN STRL NON-REIN LRG LVL3 (GOWN DISPOSABLE) ×4 IMPLANT
GRAFT GORETEX STRT 4-7X45 (Vascular Products) ×2 IMPLANT
KIT BASIN OR (CUSTOM PROCEDURE TRAY) ×2 IMPLANT
KIT ROOM TURNOVER OR (KITS) ×2 IMPLANT
NS IRRIG 1000ML POUR BTL (IV SOLUTION) ×2 IMPLANT
PACK CV ACCESS (CUSTOM PROCEDURE TRAY) ×2 IMPLANT
PAD ARMBOARD 7.5X6 YLW CONV (MISCELLANEOUS) ×4 IMPLANT
SPONGE SURGIFOAM ABS GEL 100 (HEMOSTASIS) IMPLANT
SUT PROLENE 6 0 BV (SUTURE) ×4 IMPLANT
SUT VIC AB 3-0 SH 27 (SUTURE) ×2
SUT VIC AB 3-0 SH 27X BRD (SUTURE) ×2 IMPLANT
SUT VICRYL 4-0 PS2 18IN ABS (SUTURE) ×4 IMPLANT
TOWEL OR 17X24 6PK STRL BLUE (TOWEL DISPOSABLE) ×2 IMPLANT
TOWEL OR 17X26 10 PK STRL BLUE (TOWEL DISPOSABLE) ×2 IMPLANT
UNDERPAD 30X30 INCONTINENT (UNDERPADS AND DIAPERS) ×2 IMPLANT
WATER STERILE IRR 1000ML POUR (IV SOLUTION) ×2 IMPLANT

## 2012-10-02 NOTE — Preoperative (Signed)
Beta Blockers   Reason not to administer Beta Blockers:Not Applicable 

## 2012-10-02 NOTE — Anesthesia Postprocedure Evaluation (Signed)
  Anesthesia Post-op Note  Patient: Shane [name]  Procedure(s) Performed: Procedure(s) (LRB) with comments: INSERTION OF ARTERIOVENOUS (AV) GORE-TEX GRAFT ARM (Left)  Patient Location: PACU  Anesthesia Type:MAC  Level of Consciousness: awake, alert , oriented and patient cooperative  Airway and Oxygen Therapy: Patient Spontanous Breathing  Post-op Pain: mild  Post-op Assessment: Post-op Vital signs reviewed, Patient's Cardiovascular Status Stable, Respiratory Function Stable, Patent Airway, No signs of Nausea or vomiting and Pain level controlled  Post-op Vital Signs: stable  Complications: No apparent anesthesia complications

## 2012-10-02 NOTE — Op Note (Signed)
NAME: Shane [name]    MRN: [id number] DOB: 15-Aug-1931    DATE OF OPERATION: 10/02/2012  PREOP DIAGNOSIS: Chronic kidney disease  POSTOP DIAGNOSIS: Same  PROCEDURE: New left forearm loop AV graft  SURGEON: Di Kindle. Edilia Bo, MD, FACS  ASSIST: Lianne Cure PA  ANESTHESIA: local with sedation   EBL: minimal  INDICATIONS: [name] is a 79 y.o. male who is brought in for elective placement of new access. He has a functioning tunneled dialysis catheter.  FINDINGS: the brachial vein was 4.5 mm in size. The arterial limb of the graft is tunneled on the radial side of the forearm. Patient had a palpable radial pulse at the completion of the procedure.  TECHNIQUE: Patient was brought to the operating room and sedated by anesthesia. The left upper extremity was prepped and draped in the usual sterile fashion. After the skin was infiltrated with 1% lidocaine, a longitudinal incision was made just above the antecubital level. The brachial vein and brachial artery were dissected free. The brachial vein had several branches which were ligated and divided. The vein was ligated distally and then spatulated proximally. The vein was approximately 4.5 mm in diameter. The radial artery had an excellent pulse and was good-sized. Using one distal counterincision, a 4-7 mm tapered PTFE graft was tunneled in a loop fashion in the forearm with the arterial aspect of the graft along the radial aspect of the forearm. The patient was then heparinized. The brachial artery was clamped proximally and distally and a longitudinal arteriotomy was made. A segment of the 4 mm and the graft was excised the graft spatulated and sewn end-to-side to the artery using continuous 6-0 Prolene suture. The graft was then pulled the appropriate length for anastomosis to the brachial vein. The graft was cut to the appropriate length, spatulated, and sewn end-to-end to the vein using continuous 6-0 Prolene suture. At the completion  the clamps were released and there was an excellent thrill in the graft. There was a palpable radial pulse. The distal incision was closed with a deep layer of 3-0 Vicryl and the skin closed with 4-0 Vicryl. The antecubital incision was closed with a deep layer of 3-0 Vicryl and the skin closed with 4-0 Vicryl. Dermabond was applied. The patient tolerated the procedure well and was transferred to the recovery room in stable condition. All needle and sponge counts were correct.  Waverly Ferrari, MD, FACS [facility]  DATE OF DICTATION:   10/02/2012

## 2012-10-02 NOTE — Anesthesia Preprocedure Evaluation (Addendum)
Anesthesia Evaluation  Patient identified by MRN, date of birth, ID band Patient awake    Reviewed: Allergy & Precautions, H&P , NPO status , Patient's Chart, lab work & pertinent test results, reviewed documented beta blocker date and time   History of Anesthesia Complications Negative for: history of anesthetic complications  Airway Mallampati: I TM Distance: >3 FB Neck ROM: full    Dental  (+) Edentulous Upper, Edentulous Lower and Dental Advisory Given   Pulmonary shortness of breath and with exertion, pneumonia -, resolved, former smoker,          Cardiovascular hypertension, + CAD and + Past MI Rhythm:regular Rate:Normal     Neuro/Psych PSYCHIATRIC DISORDERS Depression TIACVA, No Residual Symptoms    GI/Hepatic hiatal hernia, GERD-  Medicated and Controlled,  Endo/Other  diabetes, Well Controlled, Type 2  Renal/GU Dialysis and ESRFRenal disease     Musculoskeletal   Abdominal   Peds  Hematology   Anesthesia Other Findings   Reproductive/Obstetrics                         Anesthesia Physical Anesthesia Plan  ASA: III  Anesthesia Plan: MAC and General   Post-op Pain Management:    Induction: Intravenous  Airway Management Planned: LMA  Additional Equipment:   Intra-op Plan:   Post-operative Plan: Extubation in OR  Informed Consent: I have reviewed the patients History and Physical, chart, labs and discussed the procedure including the risks, benefits and alternatives for the proposed anesthesia with the patient or authorized representative who has indicated his/her understanding and acceptance.     Plan Discussed with: CRNA, Anesthesiologist and Surgeon  Anesthesia Plan Comments:         Anesthesia Quick Evaluation

## 2012-10-02 NOTE — Interval H&P Note (Signed)
History and Physical Interval Note:  10/02/2012 6:44 AM  Shane [name]  has presented today for surgery, with the diagnosis of ESRD  The various methods of treatment have been discussed with the patient and family. After consideration of risks, benefits and other options for treatment, the patient has consented to  Procedure(s) (LRB) with comments: INSERTION OF ARTERIOVENOUS (AV) GORE-TEX GRAFT ARM (Left) as a surgical intervention .  The patient's history has been reviewed, patient examined, no change in status, stable for surgery.  I have reviewed the patient's chart and labs.  Questions were answered to the patient's satisfaction.     Askari Kinley S

## 2012-10-02 NOTE — H&P (View-Only) (Signed)
[facility] [place] HISTORY AND PHYSICAL   CC:  Non maturing fisutla Shane [name], Shane Shipper II, MD  HPI: This is a 79 y.o. male here for non maturing left brachiobasilic AVF placed 01/16/12 and venoplasty of the left basilic vein stenosis on 04/06/12.  He recently had a stroke/TIA that was associated with RLE weakness, facial droop and a "staring spell" that sounded suspicious for aphasia.  He had a 2nd episode 09/06/12 and again had the staring spell.   He comes to [place] today for evaluation of a non maturing AVF.  He has had a venoplasty on 04/06/12, but this has continued not to mature.    Past Medical History  Diagnosis Date  . Coronary artery disease   . Chronic kidney disease     on kidney transplant list  . Hypertension   . Diabetes mellitus   . Myocardial infarction     according to patient  . CVA (cerebral vascular accident)     2 mini strokes  . Shortness of breath     with exertion  . Depression   . Arthritis   . GERD (gastroesophageal reflux disease)   . H/O hiatal hernia   . ESRD (end stage renal disease)     Dialysis T/T/S   Past Surgical History  Procedure Date  . Dialysis fistula creation   . Coronary artery bypass graft 2002  . Av fistula placement 01/16/2012    Left BVT   . Venoplasty 04/06/2012    Left Basilic Vein Stenosis    No Known Allergies  Current Outpatient Prescriptions  Medication Sig Dispense Refill  . allopurinol (ZYLOPRIM) 100 MG tablet Take 100 mg by mouth daily.       Marland Kitchen aspirin 81 MG chewable tablet Chew 1 tablet (81 mg total) by mouth daily.  90 tablet  0  . atorvastatin (LIPITOR) 40 MG tablet Take 40 mg by mouth daily.      . citalopram (CELEXA) 40 MG tablet Take 40 mg by mouth at bedtime.      . ergocalciferol (VITAMIN D2) 50000 UNITS capsule Take 50,000 Units by mouth every 30 (thirty) days. Takes the 1st of the month      . finasteride (PROSCAR) 5 MG tablet Take 5 mg by mouth daily.       . multivitamin (RENA-VIT) TABS  tablet Take 1 tablet by mouth daily.  30 tablet  0  . NAMENDA 10 MG tablet Take 10 mg by mouth at bedtime.       . Nutritional Supplements (FEEDING SUPPLEMENT, NEPRO CARB STEADY,) LIQD Take 237 mLs by mouth 2 (two) times daily between meals.  60 Can  0  . PLAVIX 75 MG tablet Take 75 mg by mouth daily.       . ranitidine (ZANTAC) 300 MG tablet Take 300 mg by mouth At bedtime.         Family History  Problem Relation Age of Onset  . Diabetes    . Kidney disease    . Heart disease    . Stroke      History   Social History  . Marital Status: Married    Spouse Name: N/A    Number of Children: 5  . Years of Education: N/A   Occupational History  . retired    Social History Main Topics  . Smoking status: Never Smoker   . Smokeless tobacco: Former Neurosurgeon    Types: Chew    Quit date: 08/26/2009  .  Alcohol Use: No  . Drug Use: No  . Sexually Active: Not on file   Other Topics Concern  . Not on file   Social History Narrative  . No narrative on file     ROS: [x]  Positive   [ ]  Negative   [ ]  All sytems reviewed and are negative  Cardiovascular: [] chest pain; [] chest pressure; [] palpitations; [] SOB lying flat; [] DOE; [] pain in legs with walking; [] pain in legs when lying flat; [] Hx of DVT; [] Hx phlebitis; [x] swelling in legs; [] varicose veins Pulmonary: [] productive cough; [] asthma; [] wheezing Neurologic:  [] Hx CVA;  [] weakness in arms or legs; [] numbness in arms or legs; [] difficulty in speaking or slurred speech; [] temporary loss of vision in one eye; [] dizziness Hematologic:  [] bleeding problems; [] clots easily GI:  [] vomiting blood; []  blood in stool; [] PUD GU: []  Dysuria; [] hematuria Psychiatric:  [x] Hx major depression Integumentary:  [] rashes; [] ulcers Constitutional:  [] fever; [] chills    PHYSICAL EXAMINATION:  Filed Vitals:   09/21/12 1529  BP: 137/74  Pulse: 78   Body mass index is 26.47 kg/(m^2).  General:  WDWN in NAD Gait: Normal HENT: WNL Eyes:  PERRL Pulmonary: normal non-labored breathing , without Rales, rhonchi,  wheezing Cardiac: RRR, without  Murmurs, rubs or gallops; No carotid bruits Abdomen: soft, NT, no masses Skin: no rashes, ulcers noted Vascular Exam/Pulses: + left radial palpable pulse.  There is a thrill in the left BVT; Extremities without ischemic changes, no Gangrene , no cellulitis; no open wounds;  Musculoskeletal: no muscle wasting or atrophy  Neurologic: A&O X 3; Appropriate Affect ; SENSATION: normal; MOTOR FUNCTION:  moving all extremities equally. Speech is fluent/normal  Non-Invasive Vascular Imaging:  Dialysis fistula duplex evalulation 09/21/12:  -antegrade, triphasic flow noted in the left radial and brachial arteries. -bidirectional flow noted in th partially occlusive mid to distal brachium level outflow vein -no flow is noted within the proximal brachium level outflow vein  1.  The left BVT AVF outflow vein demonstrates partial and total occlusions 2.  The previous exam on 05/06/12 demonstrated multiple areas of hemodynamically significant increased in velocities within the brachium level outflow vein.   ASSESSMENT/PLAN: 79 y.o. male with a non maturing BVT on the left who has recently had a CVA/TIA   -given that the BVT had a tight stenosis and looking back at the fistulogram, it does not look as if an intervention will be successful, he will need another access. --discussed with pt options of Left arm graft vs new fistula on the right. -his previous vein mapping was reviewed and his veins were about the same bilaterally.  He previously had a failed left radio cephalic AVF. -given that he did have some right sided weakness with his TIA/CVA and his veins were about the same on previous vein mapping, we will avoid the right arm at this time. -will with put in a left forearm vs. Left upper arm graft pending intraoperative ultrasound.   Doreatha Massed, PA-C [facility] [phone]  Clinic MD:  Myra Gianotti   I agree with the above. The patient has had 2 failed attempts at fistula placement on the left. He is now on dialysis as of the past month via a right-sided catheter. Vein mapping showed that he is right venous system as well as the left were very similar. He has recently had a stroke which has affected his right side. After a well-informed discussion, we have decided to proceed with left forearm versus upper arm Gore-Tex graft. I told him  I would evaluate his outflow vein in the operating room and place either a forearm or upper arm graft. He will stop his Plavix 5 days prior to his operation.  Durene Cal

## 2012-10-02 NOTE — Transfer of Care (Signed)
Immediate Anesthesia Transfer of Care Note  Patient: Shane [name]  Procedure(s) Performed: Procedure(s) (LRB) with comments: INSERTION OF ARTERIOVENOUS (AV) GORE-TEX GRAFT ARM (Left)  Patient Location: PACU  Anesthesia Type:MAC  Level of Consciousness: awake, alert  and oriented  Airway & Oxygen Therapy: Patient Spontanous Breathing  Post-op Assessment: Report given to PACU RN and Post -op Vital signs reviewed and stable  Post vital signs: Reviewed and stable  Complications: No apparent anesthesia complications

## 2012-10-05 ENCOUNTER — Encounter (HOSPITAL_COMMUNITY): Payer: Self-pay | Admitting: Vascular Surgery

## 2012-10-05 MED FILL — Mupirocin Oint 2%: CUTANEOUS | Qty: 22 | Status: AC

## 2012-10-06 ENCOUNTER — Telehealth: Payer: Self-pay

## 2012-10-06 NOTE — Telephone Encounter (Signed)
Wife called to report pt. Has continued swelling of left hand.  Stated he has a lot of pain and can't bend his finger.  Stated she spoke with Dr. Edilia Bo a couple days ago.  Advised per Dr. Edilia Bo to increase taking pain medication to every 3-4 hrs. Prn.  Stated pt. Has difficulty gripping with left hand.  Encouraged to elevate left arm/hand on several pillows, above level of heart, and to gently exercise fingers.  Denies that pt. Has any numbness or tingling of fingers left hand.  Wife advised to call office if symptoms don't improve.  Verb. Understanding.

## 2012-11-16 ENCOUNTER — Other Ambulatory Visit: Payer: Self-pay | Admitting: *Deleted

## 2012-11-16 ENCOUNTER — Encounter: Payer: Self-pay | Admitting: Neurology

## 2012-11-16 ENCOUNTER — Ambulatory Visit (INDEPENDENT_AMBULATORY_CARE_PROVIDER_SITE_OTHER): Payer: Medicare Other | Admitting: Neurology

## 2012-11-16 VITALS — BP 140/68 | HR 80 | Temp 97.9°F | Ht 68.0 in | Wt 161.0 lb

## 2012-11-16 DIAGNOSIS — F32A Depression, unspecified: Secondary | ICD-10-CM

## 2012-11-16 DIAGNOSIS — G40209 Localization-related (focal) (partial) symptomatic epilepsy and epileptic syndromes with complex partial seizures, not intractable, without status epilepticus: Secondary | ICD-10-CM

## 2012-11-16 DIAGNOSIS — E119 Type 2 diabetes mellitus without complications: Secondary | ICD-10-CM

## 2012-11-16 DIAGNOSIS — I519 Heart disease, unspecified: Secondary | ICD-10-CM

## 2012-11-16 DIAGNOSIS — F329 Major depressive disorder, single episode, unspecified: Secondary | ICD-10-CM

## 2012-11-16 MED ORDER — DIVALPROEX SODIUM ER 500 MG PO TB24: 500.0000 mg | ORAL_TABLET | Freq: Every day | ORAL | Status: DC

## 2012-11-16 MED ORDER — DIVALPROEX SODIUM ER 500 MG PO TB24: 500.0000 mg | ORAL_TABLET | Freq: Every day | ORAL | Status: AC

## 2012-11-16 NOTE — Progress Notes (Signed)
HPI: Mr Riolo is a 35 year African American male seen for followup following hospital consultation for stroke on 09/06/2012. He presented with 2 episodes in the last 48 hours of sudden onset of altered awareness and speech difficulties and felt to be a TIA. The wife who was an eyewitness stated that he was staring and unresponsive with and placed appearance on his face and did not respond to her calling him for a minute or 2. Following this he was quite tired. And one of episodes of the question of right facial droop. None of these episodes occurred in church and was more prolonged than the previous one. He was initially taken to [facility] and CT head was unremarkable this subsequent to transferred to [facility] and MRI scan of the brain did not show definite stroke but did show a questionable punctate accurate infarct to the left of the midline in the mid brain which was felt to be clinically silent and not the cause of his symptoms. 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 He had some lower extremity swelling and hence venous Doppler of the legs was obtained which showed no evidence of DVT. Vascular risk factors  ncluded  hypertension, diabetes, coronary artery disease and hyperlipidemia. He was started on aspirin which he seems to be tolerating well and has had several similar episodes at a frequency of one per week until 2 weeks ago. The wife is unable to and find a specific triggers or releiving factors for these. The fact that these have occurred in a recurrent and stereotypical fashion raise the concern of possible complex partial seizures. The patient has history of mild dementia and has been followed by Dr. Dale Sunol, neurologist in [place]. He is currently on Namenda and the wife  feels that his cognitive status is quite stable. On Mini-Mental status testing today he scored 26/30. ROS: 14system review of systems is positive for swelling in the legs, hearing loss, most, feeling hot, joint pain, joint swelling, memory loss, confusion, weakness, smoking and depression Physical Exam General: well developed, well nourished elderly African American male, seated, in no evident distress Head: head normocephalic and atraumatic. Orohparynx benign Neck: supple with no carotid or supraclavicular bruits Cardiovascular: regular rate and rhythm, soft murmur Lower extremity exam reveals 1+ pedal edema and distal pulses are felt. Left upper extremity dialysis fistula is noted.  Skin shows midline  CABG old surgical scar in the chest. There is dialysis catheter noted in the right infraclavicular  Neurologic Exam Mental Status: Awake and fully alert. Oriented to place and time. Mini-Mental status exam score 26/30 with deficits in orientation and calculation. Animal fluency test 9 only. Geriatric depression scale 3 not depressed. Clock drawing 4/4. Myrtis Ser index of independence in activities of daily living score 6. Instrumental activities of daily living scale score 6. Attention span, concentration and fund of knowledge appropriate. Mood and affect appropriate.  Cranial Nerves: Fundoscopic exam reveals sharp disc margins. Pupils equal, briskly reactive to light. Extraocular movements full without nystagmus. Visual fields full to confrontation. Hearing intact and symmetric to finer snap. Facial sensation intact. Face, tongue, palate move normally and symmetrically. Neck flexion and extension normal.  Motor: Normal bulk and tone. Normal strength in all tested extremity muscles. Sensory.: intact to tough and pinprick and vibratory.  Coordination: Rapid alternating movements normal in all extremities. Finger-to-nose and  heel-to-shin performed accurately bilaterally. Gait and Station: Arises from  chair without difficulty. Stance is normal. Gait demonstrates normal stride length and balance . Unable to heel, toe and tandem walk without difficulty.  Reflexes: 1+ and symmetric. Toes downgoing.     ASSESSMENT::  81year male with recurrent stereotypical episodes of speech difficulties with altered awareness possibly complex partial seizures. TIAs less likely given recurrent stereotypical  nature of the symptoms. Multiple vascular risk factors. Mild baseline dementia well controlled on Namenda   PLAN: Trial of Depakote ER 500 mg daily. Check EEG. Continue aspirin for stroke prevention with strict control of hypertension with blood pressure goal below 130/90 and lipids with LDL cholesterol goal below 100 mg percent. Followup with Dr. Dale Toomsuba neurologist in [place]. No followup appointment is necessary with me.

## 2012-11-16 NOTE — Patient Instructions (Signed)
I am concerned that the patient has had several of these brief episodes of altered awareness and speech difficulties which may represent complex partial seizures. Start Depakote ER 500 milligrams daily and check EEG. Patient does have a neurologist   Dr. Dale Bowlegs I would recommend he have EEG done at [facility] and have him radiate and followup. Continue Namenda for dementia and aspirin for stroke prevention with strict control of hypertension with blood pressure goal below 130/90   and hyperlipidemia with LDL cholesterol goal below 100 mg percent. Followup with Dr. Adella Hare in the future and no followup is necessary with me.Marland Kitchen

## 2012-11-20 ENCOUNTER — Other Ambulatory Visit (HOSPITAL_COMMUNITY): Payer: Self-pay | Admitting: Nephrology

## 2012-11-20 DIAGNOSIS — N186 End stage renal disease: Secondary | ICD-10-CM

## 2012-11-25 ENCOUNTER — Ambulatory Visit (HOSPITAL_COMMUNITY)
Admission: RE | Admit: 2012-11-25 | Discharge: 2012-11-25 | Disposition: A | Discharge status: Home / Self Care | Payer: Medicare Other | Source: Ambulatory Visit | Attending: Nephrology | Admitting: Nephrology

## 2012-11-25 VITALS — BP 131/68 | HR 78

## 2012-11-25 DIAGNOSIS — Z992 Dependence on renal dialysis: Secondary | ICD-10-CM | POA: Insufficient documentation

## 2012-11-25 DIAGNOSIS — N186 End stage renal disease: Secondary | ICD-10-CM | POA: Insufficient documentation

## 2012-11-25 DIAGNOSIS — Z4901 Encounter for fitting and adjustment of extracorporeal dialysis catheter: Secondary | ICD-10-CM | POA: Insufficient documentation

## 2012-11-25 MED ORDER — CHLORHEXIDINE GLUCONATE 4 % EX LIQD
CUTANEOUS | Status: AC
  Filled 2012-11-25: qty 15

## 2012-11-25 NOTE — Procedures (Signed)
Successful RT IJ HD CATH REMOVAL NO COMP STABLE  

## 2012-11-27 ENCOUNTER — Telehealth (HOSPITAL_COMMUNITY): Payer: Self-pay | Admitting: *Deleted

## 2012-12-12 ENCOUNTER — Other Ambulatory Visit (HOSPITAL_COMMUNITY): Payer: Self-pay | Admitting: Nephrology

## 2012-12-12 DIAGNOSIS — N186 End stage renal disease: Secondary | ICD-10-CM

## 2012-12-14 ENCOUNTER — Encounter (HOSPITAL_COMMUNITY): Payer: Self-pay

## 2012-12-14 ENCOUNTER — Other Ambulatory Visit (HOSPITAL_COMMUNITY): Payer: Self-pay | Admitting: Nephrology

## 2012-12-14 ENCOUNTER — Other Ambulatory Visit: Payer: Self-pay

## 2012-12-14 ENCOUNTER — Ambulatory Visit (HOSPITAL_COMMUNITY)
Admission: RE | Admit: 2012-12-14 | Discharge: 2012-12-14 | Disposition: A | Discharge status: Home / Self Care | Payer: Medicare Other | Source: Ambulatory Visit | Attending: Nephrology | Admitting: Nephrology

## 2012-12-14 DIAGNOSIS — N186 End stage renal disease: Secondary | ICD-10-CM

## 2012-12-14 DIAGNOSIS — T82898A Other specified complication of vascular prosthetic devices, implants and grafts, initial encounter: Secondary | ICD-10-CM | POA: Insufficient documentation

## 2012-12-14 DIAGNOSIS — Y832 Surgical operation with anastomosis, bypass or graft as the cause of abnormal reaction of the patient, or of later complication, without mention of misadventure at the time of the procedure: Secondary | ICD-10-CM | POA: Insufficient documentation

## 2012-12-14 DIAGNOSIS — Z8701 Personal history of pneumonia (recurrent): Secondary | ICD-10-CM | POA: Insufficient documentation

## 2012-12-14 DIAGNOSIS — Z8673 Personal history of transient ischemic attack (TIA), and cerebral infarction without residual deficits: Secondary | ICD-10-CM | POA: Insufficient documentation

## 2012-12-14 DIAGNOSIS — K219 Gastro-esophageal reflux disease without esophagitis: Secondary | ICD-10-CM | POA: Insufficient documentation

## 2012-12-14 DIAGNOSIS — I2510 Atherosclerotic heart disease of native coronary artery without angina pectoris: Secondary | ICD-10-CM | POA: Insufficient documentation

## 2012-12-14 DIAGNOSIS — I252 Old myocardial infarction: Secondary | ICD-10-CM | POA: Insufficient documentation

## 2012-12-14 DIAGNOSIS — M129 Arthropathy, unspecified: Secondary | ICD-10-CM | POA: Insufficient documentation

## 2012-12-14 DIAGNOSIS — E119 Type 2 diabetes mellitus without complications: Secondary | ICD-10-CM | POA: Insufficient documentation

## 2012-12-14 DIAGNOSIS — I82619 Acute embolism and thrombosis of superficial veins of unspecified upper extremity: Secondary | ICD-10-CM | POA: Insufficient documentation

## 2012-12-14 DIAGNOSIS — F3289 Other specified depressive episodes: Secondary | ICD-10-CM | POA: Insufficient documentation

## 2012-12-14 DIAGNOSIS — I120 Hypertensive chronic kidney disease with stage 5 chronic kidney disease or end stage renal disease: Secondary | ICD-10-CM | POA: Insufficient documentation

## 2012-12-14 DIAGNOSIS — F329 Major depressive disorder, single episode, unspecified: Secondary | ICD-10-CM | POA: Insufficient documentation

## 2012-12-14 MED ORDER — FLUMAZENIL 0.5 MG/5ML IV SOLN
INTRAVENOUS | Status: AC
  Filled 2012-12-14: qty 5

## 2012-12-14 MED ORDER — HEPARIN SODIUM (PORCINE) 1000 UNIT/ML IJ SOLN
INTRAMUSCULAR | Status: AC
  Filled 2012-12-14: qty 1

## 2012-12-14 MED ORDER — IOHEXOL 300 MG/ML  SOLN
INTRAMUSCULAR | Status: AC | PRN
  Administered 2012-12-14: 10 mL via INTRAVENOUS

## 2012-12-14 MED ORDER — FENTANYL CITRATE 0.05 MG/ML IJ SOLN
INTRAMUSCULAR | Status: AC | PRN
  Administered 2012-12-14: 50 ug via INTRAVENOUS
  Administered 2012-12-14: 25 ug via INTRAVENOUS

## 2012-12-14 MED ORDER — CEFAZOLIN SODIUM-DEXTROSE 2-3 GM-% IV SOLR
INTRAVENOUS | Status: AC
  Administered 2012-12-14: 1000 mg
  Filled 2012-12-14: qty 50

## 2012-12-14 MED ORDER — NALOXONE HCL 0.4 MG/ML IJ SOLN
INTRAMUSCULAR | Status: AC
  Filled 2012-12-14: qty 1

## 2012-12-14 MED ORDER — IOHEXOL 300 MG/ML  SOLN
100.0000 mL | Freq: Once | INTRAMUSCULAR | Status: AC | PRN
  Administered 2012-12-14: 10 mL via INTRAVENOUS

## 2012-12-14 MED ORDER — SODIUM CHLORIDE 0.9 % IV SOLN
INTRAVENOUS | Status: AC | PRN
  Administered 2012-12-14: 30 mL via INTRAVENOUS

## 2012-12-14 MED ORDER — FENTANYL CITRATE 0.05 MG/ML IJ SOLN
INTRAMUSCULAR | Status: AC
  Filled 2012-12-14: qty 2

## 2012-12-14 MED ORDER — MIDAZOLAM HCL 2 MG/2ML IJ SOLN
INTRAMUSCULAR | Status: AC
  Filled 2012-12-14: qty 2

## 2012-12-14 MED ORDER — ALTEPLASE 100 MG IV SOLR
2.0000 mg | Freq: Once | INTRAVENOUS | Status: DC
  Filled 2012-12-14: qty 2

## 2012-12-14 MED ORDER — GELATIN ABSORBABLE 12-7 MM EX MISC
CUTANEOUS | Status: AC
  Filled 2012-12-14: qty 1

## 2012-12-14 MED ORDER — MIDAZOLAM HCL 2 MG/2ML IJ SOLN
INTRAMUSCULAR | Status: AC | PRN
  Administered 2012-12-14 (×2): 1 mg via INTRAVENOUS

## 2012-12-14 NOTE — H&P (Signed)
Shane [name] is an 79 y.o. male.   Chief Complaint: clotted left forearm dialysis graft Last use: 4/17: good flow Last attempt: 4/19: clotted Never intervention Scheduled now for left forearm dialysis graft thrombolysis and possible angioplasty/stent. Possible dialysis catheter placement if needed. HPI: ESRD; CAD/MI; HTN; DM   Past Medical History  Diagnosis Date  . Coronary artery disease   . Chronic kidney disease     on kidney transplant list  . Hypertension   . Diabetes mellitus   . Myocardial infarction     according to patient  . CVA (cerebral vascular accident)     2 mini strokes  . Shortness of breath     with exertion  . Depression   . Arthritis   . GERD (gastroesophageal reflux disease)   . H/O hiatal hernia   . ESRD (end stage renal disease)     Dialysis T/T/S  . Pneumonia     stayed at [facility]  . Memory loss     Past Surgical History  Procedure Laterality Date  . Dialysis fistula creation    . Coronary artery bypass graft  2002  . Av fistula placement  01/16/2012    Left BVT   . Venoplasty  04/06/2012    Left Basilic Vein Stenosis  . Av fistula placement Left 10/02/2012    Procedure: INSERTION OF ARTERIOVENOUS (AV) GORE-TEX GRAFT ARM;  Surgeon: Shane Hint, MD;  Location: [facility] OR;  Service: Vascular;  Laterality: Left;    Family History  Problem Relation Age of Onset  . Diabetes    . Kidney disease    . Heart disease    . Stroke     Social History:  reports that he has never smoked. He quit smokeless tobacco use about 3 years ago. His smokeless tobacco use included Chew. He reports that he does not drink alcohol or use illicit drugs.  Allergies: No Known Allergies   (Not in a hospital admission)  No results found for this or any previous visit (from the past 48 hour(s)). No results found.  Review of Systems  Constitutional: Negative for fever.  Respiratory: Negative for shortness of breath.   Cardiovascular: Negative for chest pain.   Gastrointestinal: Negative for nausea and vomiting.  Neurological: Negative for weakness.    Blood pressure 165/73, pulse 68, temperature 98.6 F (37 C), temperature source Oral, resp. rate 14, SpO2 100.00%. Physical Exam  Constitutional: He is oriented to person, place, and time. He appears well-developed and well-nourished.  Cardiovascular: Normal rate, regular rhythm and normal heart sounds.   No murmur heard. Respiratory: Effort normal and breath sounds normal. He has no wheezes.  GI: Soft. Bowel sounds are normal. There is no tenderness.  Musculoskeletal: Normal range of motion.  Clotted left forearm graft  Neurological: He is alert and oriented to person, place, and time.  Psychiatric: He has a normal mood and affect. His behavior is normal. Judgment and thought content normal.     Assessment/Plan Clotted Rt arm dialysis graft  Scheduled for thrombolysis and poss pta/stent placement Poss dialysis catheter if needed Pt aware of procedure benefits and risks and agreeable to proceed Consent signed and in chart  Shane [name] A 12/14/2012, 7:54 AM

## 2012-12-14 NOTE — Procedures (Signed)
Unsuccessful RT FA AVG DECLOT OUTLFOW VEIN OCCLUDED AND VERY SMALL IN CALIBER  RT IJ HD CATH INSERTED TIPS SVC/RA NO COMP STABLE

## 2012-12-14 NOTE — ED Notes (Signed)
Potassium results of 3.7 called to Emelia Loron, PA no new orders given at this time.

## 2012-12-15 ENCOUNTER — Encounter (HOSPITAL_COMMUNITY): Payer: Self-pay | Admitting: *Deleted

## 2012-12-15 MED ORDER — DEXTROSE 5 % IV SOLN
1.5000 g | INTRAVENOUS | Status: AC
  Administered 2012-12-16: 1.5 g via INTRAVENOUS
  Filled 2012-12-15: qty 1.5

## 2012-12-16 ENCOUNTER — Encounter (HOSPITAL_COMMUNITY): Payer: Self-pay | Admitting: Anesthesiology

## 2012-12-16 ENCOUNTER — Telehealth: Payer: Self-pay | Admitting: Vascular Surgery

## 2012-12-16 ENCOUNTER — Ambulatory Visit (HOSPITAL_COMMUNITY)
Admission: RE | Admit: 2012-12-16 | Discharge: 2012-12-16 | Disposition: A | Discharge status: Home / Self Care | Payer: Medicare Other | Source: Ambulatory Visit | Attending: Vascular Surgery | Admitting: Vascular Surgery

## 2012-12-16 ENCOUNTER — Encounter (HOSPITAL_COMMUNITY): Payer: Self-pay | Admitting: Surgery

## 2012-12-16 ENCOUNTER — Encounter (HOSPITAL_COMMUNITY)
Admission: RE | Disposition: A | Discharge status: Home / Self Care | Payer: Self-pay | Source: Ambulatory Visit | Attending: Vascular Surgery

## 2012-12-16 ENCOUNTER — Ambulatory Visit (HOSPITAL_COMMUNITY): Payer: Medicare Other | Admitting: Anesthesiology

## 2012-12-16 DIAGNOSIS — Y832 Surgical operation with anastomosis, bypass or graft as the cause of abnormal reaction of the patient, or of later complication, without mention of misadventure at the time of the procedure: Secondary | ICD-10-CM | POA: Insufficient documentation

## 2012-12-16 DIAGNOSIS — Z7982 Long term (current) use of aspirin: Secondary | ICD-10-CM | POA: Insufficient documentation

## 2012-12-16 DIAGNOSIS — Z8673 Personal history of transient ischemic attack (TIA), and cerebral infarction without residual deficits: Secondary | ICD-10-CM | POA: Insufficient documentation

## 2012-12-16 DIAGNOSIS — T82898A Other specified complication of vascular prosthetic devices, implants and grafts, initial encounter: Secondary | ICD-10-CM

## 2012-12-16 DIAGNOSIS — Z951 Presence of aortocoronary bypass graft: Secondary | ICD-10-CM | POA: Insufficient documentation

## 2012-12-16 DIAGNOSIS — Z87891 Personal history of nicotine dependence: Secondary | ICD-10-CM | POA: Insufficient documentation

## 2012-12-16 DIAGNOSIS — I2510 Atherosclerotic heart disease of native coronary artery without angina pectoris: Secondary | ICD-10-CM | POA: Insufficient documentation

## 2012-12-16 DIAGNOSIS — M129 Arthropathy, unspecified: Secondary | ICD-10-CM | POA: Insufficient documentation

## 2012-12-16 DIAGNOSIS — K219 Gastro-esophageal reflux disease without esophagitis: Secondary | ICD-10-CM | POA: Insufficient documentation

## 2012-12-16 DIAGNOSIS — N186 End stage renal disease: Secondary | ICD-10-CM | POA: Insufficient documentation

## 2012-12-16 DIAGNOSIS — I120 Hypertensive chronic kidney disease with stage 5 chronic kidney disease or end stage renal disease: Secondary | ICD-10-CM | POA: Insufficient documentation

## 2012-12-16 DIAGNOSIS — E119 Type 2 diabetes mellitus without complications: Secondary | ICD-10-CM | POA: Insufficient documentation

## 2012-12-16 HISTORY — PX: THROMBECTOMY AND REVISION OF ARTERIOVENTOUS (AV) GORETEX  GRAFT: SHX6120

## 2012-12-16 LAB — POCT I-STAT 4, (NA,K, GLUC, HGB,HCT)
Glucose, Bld: 115 mg/dL — ABNORMAL HIGH (ref 70–99)
Potassium: 3.8 mEq/L (ref 3.5–5.1)

## 2012-12-16 SURGERY — THROMBECTOMY AND REVISION OF ARTERIOVENTOUS (AV) GORETEX  GRAFT
Anesthesia: General | Site: Arm Upper | Laterality: Left | Wound class: Clean

## 2012-12-16 MED ORDER — PROPOFOL 10 MG/ML IV BOLUS
INTRAVENOUS | Status: DC | PRN
  Administered 2012-12-16: 140 mg via INTRAVENOUS

## 2012-12-16 MED ORDER — SODIUM CHLORIDE 0.9 % IR SOLN
Status: DC | PRN
  Administered 2012-12-16: 11:00:00

## 2012-12-16 MED ORDER — BUPIVACAINE HCL (PF) 0.5 % IJ SOLN
INTRAMUSCULAR | Status: AC
  Filled 2012-12-16: qty 30

## 2012-12-16 MED ORDER — ONDANSETRON HCL 4 MG/2ML IJ SOLN
INTRAMUSCULAR | Status: DC | PRN
  Administered 2012-12-16: 4 mg via INTRAVENOUS

## 2012-12-16 MED ORDER — MIDAZOLAM HCL 5 MG/5ML IJ SOLN
INTRAMUSCULAR | Status: DC | PRN
  Administered 2012-12-16 (×2): 1 mg via INTRAVENOUS

## 2012-12-16 MED ORDER — THROMBIN 20000 UNITS EX SOLR
CUTANEOUS | Status: AC
  Filled 2012-12-16: qty 20000

## 2012-12-16 MED ORDER — ACETAMINOPHEN 10 MG/ML IV SOLN: 1000.0000 mg | Freq: Once | INTRAVENOUS | Status: DC | PRN

## 2012-12-16 MED ORDER — LIDOCAINE HCL (CARDIAC) 20 MG/ML IV SOLN
INTRAVENOUS | Status: DC | PRN
  Administered 2012-12-16: 40 mg via INTRAVENOUS

## 2012-12-16 MED ORDER — SODIUM CHLORIDE 0.9 % IV SOLN
INTRAVENOUS | Status: DC
  Administered 2012-12-16: 35 mL/h via INTRAVENOUS
  Administered 2012-12-16 (×2): via INTRAVENOUS

## 2012-12-16 MED ORDER — PHENYLEPHRINE HCL 10 MG/ML IJ SOLN
INTRAMUSCULAR | Status: DC | PRN
  Administered 2012-12-16 (×5): 80 ug via INTRAVENOUS
  Administered 2012-12-16: 160 ug via INTRAVENOUS
  Administered 2012-12-16 (×7): 80 ug via INTRAVENOUS

## 2012-12-16 MED ORDER — GLYCOPYRROLATE 0.2 MG/ML IJ SOLN
INTRAMUSCULAR | Status: DC | PRN
  Administered 2012-12-16: 0.4 mg via INTRAVENOUS

## 2012-12-16 MED ORDER — HEPARIN SODIUM (PORCINE) 1000 UNIT/ML IJ SOLN
INTRAMUSCULAR | Status: DC | PRN
  Administered 2012-12-16: 5000 [IU] via INTRAVENOUS

## 2012-12-16 MED ORDER — FENTANYL CITRATE 0.05 MG/ML IJ SOLN: 25.0000 ug | INTRAMUSCULAR | Status: DC | PRN

## 2012-12-16 MED ORDER — LIDOCAINE-EPINEPHRINE (PF) 1 %-1:200000 IJ SOLN
INTRAMUSCULAR | Status: AC
  Filled 2012-12-16: qty 10

## 2012-12-16 MED ORDER — SUCCINYLCHOLINE CHLORIDE 20 MG/ML IJ SOLN
INTRAMUSCULAR | Status: DC | PRN
  Administered 2012-12-16: 100 mg via INTRAVENOUS

## 2012-12-16 MED ORDER — ROCURONIUM BROMIDE 100 MG/10ML IV SOLN
INTRAVENOUS | Status: DC | PRN
  Administered 2012-12-16: 30 mg via INTRAVENOUS

## 2012-12-16 MED ORDER — OXYCODONE HCL 5 MG PO TABS: 5.0000 mg | ORAL_TABLET | ORAL | Status: DC | PRN

## 2012-12-16 MED ORDER — NEOSTIGMINE METHYLSULFATE 1 MG/ML IJ SOLN
INTRAMUSCULAR | Status: DC | PRN
  Administered 2012-12-16: 3 mg via INTRAVENOUS

## 2012-12-16 MED ORDER — THROMBIN 20000 UNITS EX SOLR
CUTANEOUS | Status: DC | PRN
  Administered 2012-12-16 (×3): via TOPICAL

## 2012-12-16 MED ORDER — ARTIFICIAL TEARS OP OINT
TOPICAL_OINTMENT | OPHTHALMIC | Status: DC | PRN
  Administered 2012-12-16: 1 via OPHTHALMIC

## 2012-12-16 MED ORDER — ONDANSETRON HCL 4 MG/2ML IJ SOLN: 4.0000 mg | Freq: Once | INTRAMUSCULAR | Status: DC | PRN

## 2012-12-16 MED ORDER — MUPIROCIN 2 % EX OINT
TOPICAL_OINTMENT | CUTANEOUS | Status: AC
  Administered 2012-12-16: 1 via NASAL
  Filled 2012-12-16: qty 22

## 2012-12-16 MED ORDER — PROTAMINE SULFATE 10 MG/ML IV SOLN
INTRAVENOUS | Status: DC | PRN
  Administered 2012-12-16: 30 mg via INTRAVENOUS
  Administered 2012-12-16: 20 mg via INTRAVENOUS

## 2012-12-16 MED ORDER — MUPIROCIN 2 % EX OINT
TOPICAL_OINTMENT | Freq: Two times a day (BID) | CUTANEOUS | Status: DC
  Filled 2012-12-16: qty 22

## 2012-12-16 MED ORDER — 0.9 % SODIUM CHLORIDE (POUR BTL) OPTIME
TOPICAL | Status: DC | PRN
  Administered 2012-12-16: 1000 mL

## 2012-12-16 MED ORDER — EPHEDRINE SULFATE 50 MG/ML IJ SOLN
INTRAMUSCULAR | Status: DC | PRN
  Administered 2012-12-16 (×2): 10 mg via INTRAVENOUS

## 2012-12-16 MED ORDER — FENTANYL CITRATE 0.05 MG/ML IJ SOLN
INTRAMUSCULAR | Status: DC | PRN
  Administered 2012-12-16: 25 ug via INTRAVENOUS
  Administered 2012-12-16: 75 ug via INTRAVENOUS
  Administered 2012-12-16: 50 ug via INTRAVENOUS
  Administered 2012-12-16: 25 ug via INTRAVENOUS
  Administered 2012-12-16: 50 ug via INTRAVENOUS
  Administered 2012-12-16: 25 ug via INTRAVENOUS

## 2012-12-16 SURGICAL SUPPLY — 51 items
BANDAGE ELASTIC 6 VELCRO ST LF (GAUZE/BANDAGES/DRESSINGS) ×2 IMPLANT
BANDAGE GAUZE ELAST BULKY 4 IN (GAUZE/BANDAGES/DRESSINGS) ×2 IMPLANT
CANISTER SUCTION 2500CC (MISCELLANEOUS) ×2 IMPLANT
CATH EMB 3FR 40CM (CATHETERS) ×2 IMPLANT
CATH EMB 4FR 40CM (CATHETERS) ×2 IMPLANT
CATH EMB 4FR 80CM (CATHETERS) IMPLANT
CLIP TI MEDIUM 6 (CLIP) ×2 IMPLANT
CLIP TI WIDE RED SMALL 6 (CLIP) ×2 IMPLANT
CLOTH BEACON ORANGE TIMEOUT ST (SAFETY) ×2 IMPLANT
COVER SURGICAL LIGHT HANDLE (MISCELLANEOUS) ×2 IMPLANT
DERMABOND ADVANCED (GAUZE/BANDAGES/DRESSINGS) ×1
DERMABOND ADVANCED .7 DNX12 (GAUZE/BANDAGES/DRESSINGS) ×1 IMPLANT
ELECT REM PT RETURN 9FT ADLT (ELECTROSURGICAL) ×2
ELECTRODE REM PT RTRN 9FT ADLT (ELECTROSURGICAL) ×1 IMPLANT
GAUZE SPONGE 4X4 16PLY XRAY LF (GAUZE/BANDAGES/DRESSINGS) ×2 IMPLANT
GEL ULTRASOUND 20GR AQUASONIC (MISCELLANEOUS) IMPLANT
GLOVE BIO SURGEON STRL SZ 6.5 (GLOVE) ×2 IMPLANT
GLOVE BIO SURGEON STRL SZ7 (GLOVE) ×2 IMPLANT
GLOVE BIOGEL PI IND STRL 6.5 (GLOVE) ×2 IMPLANT
GLOVE BIOGEL PI IND STRL 7.5 (GLOVE) ×1 IMPLANT
GLOVE BIOGEL PI INDICATOR 6.5 (GLOVE) ×2
GLOVE BIOGEL PI INDICATOR 7.5 (GLOVE) ×1
GLOVE ECLIPSE 6.0 STRL STRAW (GLOVE) ×2 IMPLANT
GLOVE ECLIPSE 7.5 STRL STRAW (GLOVE) ×4 IMPLANT
GOWN STRL NON-REIN LRG LVL3 (GOWN DISPOSABLE) ×4 IMPLANT
GOWN STRL REIN XL XLG (GOWN DISPOSABLE) ×4 IMPLANT
GRAFT GORETEX STND 6X20 (Vascular Products) ×2 IMPLANT
GRAFT GORETEXSTD 6X20 (Vascular Products) ×1 IMPLANT
KIT BASIN OR (CUSTOM PROCEDURE TRAY) ×2 IMPLANT
KIT ROOM TURNOVER OR (KITS) ×2 IMPLANT
NS IRRIG 1000ML POUR BTL (IV SOLUTION) ×2 IMPLANT
PACK CV ACCESS (CUSTOM PROCEDURE TRAY) ×2 IMPLANT
PAD ARMBOARD 7.5X6 YLW CONV (MISCELLANEOUS) ×4 IMPLANT
SPONGE GAUZE 4X4 12PLY (GAUZE/BANDAGES/DRESSINGS) ×2 IMPLANT
SPONGE LAP 18X18 X RAY DECT (DISPOSABLE) ×2 IMPLANT
SPONGE SURGIFOAM ABS GEL 100 (HEMOSTASIS) ×6 IMPLANT
STAPLER VISISTAT 35W (STAPLE) ×4 IMPLANT
SUT MNCRL AB 4-0 PS2 18 (SUTURE) ×4 IMPLANT
SUT PROLENE 5 0 C 1 24 (SUTURE) ×6 IMPLANT
SUT PROLENE 6 0 BV (SUTURE) ×2 IMPLANT
SUT SILK 2 0 (SUTURE) ×1
SUT SILK 2 0 SH (SUTURE) ×2 IMPLANT
SUT SILK 2-0 18XBRD TIE 12 (SUTURE) ×1 IMPLANT
SUT VIC AB 3-0 SH 27 (SUTURE) ×3
SUT VIC AB 3-0 SH 27X BRD (SUTURE) ×3 IMPLANT
SYR TB 1ML LUER SLIP (SYRINGE) ×2 IMPLANT
TAPE CLOTH SURG 4X10 WHT LF (GAUZE/BANDAGES/DRESSINGS) ×2 IMPLANT
TOWEL OR 17X24 6PK STRL BLUE (TOWEL DISPOSABLE) ×2 IMPLANT
TOWEL OR 17X26 10 PK STRL BLUE (TOWEL DISPOSABLE) ×2 IMPLANT
UNDERPAD 30X30 INCONTINENT (UNDERPADS AND DIAPERS) ×2 IMPLANT
WATER STERILE IRR 1000ML POUR (IV SOLUTION) ×2 IMPLANT

## 2012-12-16 NOTE — Op Note (Signed)
OPERATIVE NOTE   PROCEDURE:  1. Thrombectomy and revision of left forearm loop arteriovenous graft   PRE-OPERATIVE DIAGNOSIS: Thombosed left forearm loop arteriovenous graft   POST-OPERATIVE DIAGNOSIS: same as above   SURGEON: Leonides Sake, MD   ANESTHESIA: general   ESTIMATED BLOOD LOSS: 200 cc   FINDING(S):  1. 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 Small brachial vein throughout mid-arm 3. Weak thrill in arteriovenous graft at end of case 4. Palpable radial pulse at end of signal 5. Torn arteriole in mid-arm due to tunneler  SPECIMEN(S): none   INDICATIONS:  Shane [name] is 79 y.o. male who presents with thrombosed left forearm loop arteriovenous graft. The patient was scheduled for an attempt at thrombectomy and revision of left forearm loop arteriovenous graft after recent failed percutaneous thrombectomy and revision. Risk, benefits, and alternatives to access surgery were discussed. The patient is aware the risks include but are not limited to: bleeding, infection, steal syndrome, nerve damage, ischemic monomelic neuropathy, failure to mature, need for additional procedures, death and stroke. The patient agrees to proceed forward with the procedure.    DESCRIPTION:  After obtaining full informed written consent, the patient was brought back to the operating room and placed supine upon the operating table. The patient received IV antibiotics prior to induction. After obtaining adequate anesthesia, the patient was prepped and draped in the standard fashion for: left arm access procedure. I made an incision over the apex of the forearm loop arteriovenous graft. I dissected out the graft with electrocautery and blunt dissection. I made a graftotomy and then passed a 3 Fogarty balloon proximally down the arterial arm. After two passes, I was able to removed the arterial plug without any difficulty. I clamped the graft proximal to the graftotomy. The patient was given  5000 units of Heparin intravenously, which was a therapeutic bolus. I made an incision over the venous end of the forearm loop arteriovenous graft. I dissected out the graft with electrocautery and blunt dissection. I made a grafotomy and passed the 4 Fogarty balloon through the venous arm of this graft a few time, extracting all clot.  In this process I extracted a chronic thrombus that spanned the entire length of the venous arm of the graft.  I continued the extraction into the arterial arm, which required briefly unclamping the arterial arm of the graft.  I was able to extract a chronic thrombus extending  from the venous arm from ~2/3 of the graft length, suggesting this graft has been occluded > 2 weeks.    Under Sonosite, I identified the brachial vein in the mid-arm.  Unfortunately, the entire length appeared to be only 3 mm in diameter.  I found a segment that appeared to be possibly larger in the mid-arm.  I made an incision over the vein at this level and I dissected out this brachial vein with blunt dissection and electrocautery with great difficulty.  A previous basilic vein transposition has been done the scar tissue greatly complicated the dissection.  I found a segment of the brachial vein that was 4-5 mm in diameter which more proximally bifurcated.  As there no other candidate for distal target except the axillary vein, I felt this was our only option.  I tunneled from the venous arm exposure to this mid-arm incision with a straight tunneler. I passed a 6 mm Goretex graft through the metal tunnel and removed the tunnel, leaving the graft in place. I sewed the new graft  to the old graft in an end-to-end configuration with a 5-0 Prolene. I then pulled this graft to tension and spatulated the graft, adjusting the length in the process. I ligated the brachial vein distally and then transected the brachial vein. I spatulated the vein and lyzed several valves in this area.  I sewed the vein to graft in  an end-to-end configuration with a 5-0 Prolene.   I then passed the 4 Fogarty back through the venous arm, obtaining no thrombus. I also passed a 3 Fogarty through the arterial arm, obtaining no thrombus. I repaired the graftotomy at the apex of the graft, after backbleeding both ends. I gave the patient a total of 50 mg of Protamine to reverse anticoagulation.  After releasing all clamps, immediately there was a thrill in the graft. I placed thrombin and gelfoam into all incisions. After waiting a few minutes, all incisions were washed out and thrombin and gelfoam replaced. The apical and venous exposures were mostly dry at this point but the mid-arm exposure was actively bleeding.  After further exploration, it was evident that were an arterial injury within the subcutaneous tunnel.  I had to make sequentially multiple longitudinal incisions along the graft to find the arterial bleeder.  There was an arteriole in the subcutaneous tunnel that had been injuried by the tunneler.  I clipped the injuried arteriole proximally and distally.  I reapplied thrombin and gelfoam to the wound.  After waiting a few minutes, I removed all gelfoam and no active bleeding was noted. Both the apical and venous exposure incisions were repaired with a running stitch of 3-0 Vicryl in the subcutaneous tissue. The skin at these two exposures were closed with staples.  The mid-arm incision were repaired with a double layer of 3-0 Vicryl.  The left arm was washed off and sterile 4x4 bandages applied.  A Kerlix was wrapped around the left arm followed by an ACE wrap to apply gentle pressure to the left arm.  This left forearm arteriovenous graft is at risk for re-thrombosis due to poor venous outflow.  If this arteriovenous graft fails, I would complete a left arm and central fistulogram before proceeding with future access.  COMPLICATIONS: arteriole injury due to tunneler  CONDITION: stable  Leonides Sake, MD [facility] [place] Office: [phone] Pager: [phone]  12/16/2012, 1:48 PM

## 2012-12-16 NOTE — Transfer of Care (Signed)
Immediate Anesthesia Transfer of Care Note  Patient: Shane [name]  Procedure(s) Performed: Procedure(s) with comments: THROMBECTOMY AND REVISION OF ARTERIOVENTOUS (AV) GORETEX  GRAFT (Left) - Ultrasound guided  Patient Location: PACU  Anesthesia Type:General  Level of Consciousness: awake, alert  and oriented  Airway & Oxygen Therapy: Patient Spontanous Breathing and Patient connected to face mask oxygen  Post-op Assessment: Report given to PACU RN  Post vital signs: Reviewed and stable  Complications: No apparent anesthesia complications

## 2012-12-16 NOTE — Anesthesia Postprocedure Evaluation (Signed)
  Anesthesia Post-op Note  Patient: Shane [name]  Procedure(s) Performed: Procedure(s) with comments: THROMBECTOMY AND REVISION OF ARTERIOVENTOUS (AV) GORETEX  GRAFT (Left) - Ultrasound guided  Patient Location: PACU  Anesthesia Type:General  Level of Consciousness: awake, alert  and oriented  Airway and Oxygen Therapy: Patient Spontanous Breathing and Patient connected to nasal cannula oxygen  Post-op Pain: mild  Post-op Assessment: Post-op Vital signs reviewed, Patient's Cardiovascular Status Stable, Respiratory Function Stable and Pain level controlled  Post-op Vital Signs: stable  Complications: No apparent anesthesia complications

## 2012-12-16 NOTE — H&P (Signed)
[facility] [place]  Brief History and Physical  History of Present Illness  Shane [name] is a 79 y.o. male who presents with chief complaint: thrombosed L FA AVG.  The patient already had an attempt at percutaneous thrombectomy which failed.  The patient presents today for attempted L FA AVG thrombectomy and revision.    Past Medical History  Diagnosis Date  . Coronary artery disease   . Chronic kidney disease     on kidney transplant list  . Myocardial infarction     according to patient  . CVA (cerebral vascular accident)     2 mini strokes  . Depression   . Arthritis   . GERD (gastroesophageal reflux disease)   . H/O hiatal hernia   . ESRD (end stage renal disease)     Dialysis T/T/S  . Pneumonia     stayed at [facility]  . Memory loss   . Hypertension     not on medication  . Shortness of breath     with exertion  . Diabetes mellitus     not on medications    Past Surgical History  Procedure Laterality Date  . Dialysis fistula creation    . Coronary artery bypass graft  2002  . Av fistula placement  01/16/2012    Left BVT   . Venoplasty  04/06/2012    Left Basilic Vein Stenosis  . Av fistula placement Left 10/02/2012    Procedure: INSERTION OF ARTERIOVENOUS (AV) GORE-TEX GRAFT ARM;  Surgeon: [name], MD;  Location: [facility] OR;  Service: Vascular;  Laterality: Left;    History   Social History  . Marital Status: Married    Spouse Name: N/A    Number of Children: 5  . Years of Education: N/A   Occupational History  . retired    Social History Main Topics  . Smoking status: Never Smoker   . Smokeless tobacco: Former Neurosurgeon    Types: Chew    Quit date: 08/26/2009  . Alcohol Use: No  . Drug Use: No  . Sexually Active: Not on file   Other Topics Concern  . Not on file   Social History Narrative  . No narrative on file    Family History  Problem Relation Age of Onset  . Diabetes    . Kidney disease    . Heart disease     . Stroke      No current facility-administered medications on file prior to encounter.   Current Outpatient Prescriptions on File Prior to Encounter  Medication Sig Dispense Refill  . allopurinol (ZYLOPRIM) 100 MG tablet Take 100 mg by mouth daily.       Marland Kitchen aspirin 81 MG chewable tablet Chew 1 tablet (81 mg total) by mouth daily.  90 tablet  0  . aspirin EC 81 MG tablet Take 81 mg by mouth daily.      Marland Kitchen atorvastatin (LIPITOR) 40 MG tablet Take 40 mg by mouth daily.      . calcitRIOL (ROCALTROL) 0.5 MCG capsule       . citalopram (CELEXA) 40 MG tablet Take 40 mg by mouth at bedtime.      . clopidogrel (PLAVIX) 75 MG tablet Take 75 mg by mouth daily.      . divalproex (DEPAKOTE ER) 500 MG 24 hr tablet Take 1 tablet (500 mg total) by mouth daily.  30 tablet  3  . doxycycline (VIBRAMYCIN) 100 MG capsule       .  ergocalciferol (VITAMIN D2) 50000 UNITS capsule Take 50,000 Units by mouth every 30 (thirty) days. Takes the 1st of the month      . finasteride (PROSCAR) 5 MG tablet Take 5 mg by mouth daily.       . midodrine (PROAMATINE) 5 MG tablet Take 5 mg by mouth Twice daily.      . multivitamin (RENA-VIT) TABS tablet Take 1 tablet by mouth daily.  30 tablet  0  . NAMENDA 10 MG tablet Take 10 mg by mouth 2 (two) times daily.       . Nutritional Supplements (FEEDING SUPPLEMENT, NEPRO CARB STEADY,) LIQD Take 237 mLs by mouth 2 (two) times daily between meals.  60 Can  0  . ranitidine (ZANTAC) 150 MG tablet Take 150 mg by mouth 2 (two) times daily.        No Known Allergies  Review of Systems: As listed above, otherwise negative.  Physical Examination  Filed Vitals:   12/16/12 0813  BP: 130/78  Pulse: 82  Temp: 98.2 F (36.8 C)  TempSrc: Oral  Resp: 16  Height: 5\' 6"  (1.676 m)  Weight: 167 lb 12.3 oz (76.099 kg)  SpO2: 100%    General: A&O x 3, WDWN  Pulmonary: Sym exp, good air movt, CTAB, no rales, rhonchi, & wheezing  Cardiac: RRR, Nl S1, S2, no Murmurs, rubs or  gallops  Gastrointestinal: soft, NTND, -G/R, - HSM, - masses, - CVAT B  Musculoskeletal: M/S 5/5 throughout , Extremities without ischemic changes , L FA AVG thrombosed  Laboratory See iStat  Medical Decision Making  [name] is a 79 y.o. male who presents with: thrombosed L FA AVG.   The patient is scheduled for: L FA AVG thrombectomy and revision  Risk, benefits, and alternatives to access surgery were discussed.  The patient is aware the risks include but are not limited to: bleeding, infection, steal syndrome, nerve damage, ischemic monomelic neuropathy, failure to mature, and need for additional procedures.  The patient is aware of the risks and agrees to proceed.  Leonides Sake, MD [facility] [place] Office: [phone] Pager: [phone]  12/16/2012, 7:50 AM

## 2012-12-16 NOTE — Anesthesia Preprocedure Evaluation (Addendum)
Anesthesia Evaluation  Patient identified by MRN, date of birth, ID band Patient awake    Reviewed: Allergy & Precautions, H&P , NPO status , Patient's Chart, lab work & pertinent test results  Airway Mallampati: II      Dental  (+) Edentulous Upper and Edentulous Lower   Pulmonary  breath sounds clear to auscultation        Cardiovascular Rhythm:Regular Rate:Normal     Neuro/Psych    GI/Hepatic   Endo/Other    Renal/GU      Musculoskeletal   Abdominal   Peds  Hematology   Anesthesia Other Findings   Reproductive/Obstetrics                           Anesthesia Physical Anesthesia Plan  ASA: III  Anesthesia Plan: General   Post-op Pain Management:    Induction: Intravenous  Airway Management Planned: LMA  Additional Equipment:   Intra-op Plan:   Post-operative Plan:   Informed Consent: I have reviewed the patients History and Physical, chart, labs and discussed the procedure including the risks, benefits and alternatives for the proposed anesthesia with the patient or authorized representative who has indicated his/her understanding and acceptance.     Plan Discussed with: CRNA and Surgeon  Anesthesia Plan Comments: (ESRD Last HD 4/22 K- 3.7 Type 2 DM glucose 115 S/P CVA per records denies deficit CAD S/P CABG 2002, denies angina EF 50-55% by echo 1/14 Gout  Plan GA with LMA)    Anesthesia Quick Evaluation

## 2012-12-16 NOTE — Preoperative (Signed)
Beta Blockers   Reason not to administer Beta Blockers:Not Applicable 

## 2012-12-16 NOTE — Telephone Encounter (Addendum)
Message copied by Fredrich Birks on Wed Dec 16, 2012  3:43 PM ------      Message from: Melene Plan      Created: Wed Dec 16, 2012  2:47 PM                   ----- Message -----         From: Fransisco Hertz, MD         Sent: 12/16/2012   2:02 PM           To: Reuel Derby, Melene Plan, RN            RAEVON BROOM      [id number]      1931/07/14            PROCEDURE:        Thrombectomy and revision of left forearm loop arteriovenous graft             Follow-up: 2 weeks for staple removal             ------   12/16/12: lm on patients cell #, dpm

## 2012-12-17 ENCOUNTER — Encounter (HOSPITAL_COMMUNITY): Payer: Self-pay | Admitting: Vascular Surgery

## 2012-12-29 ENCOUNTER — Encounter: Payer: Self-pay | Admitting: Vascular Surgery

## 2012-12-30 ENCOUNTER — Ambulatory Visit (INDEPENDENT_AMBULATORY_CARE_PROVIDER_SITE_OTHER): Payer: Medicare Other | Admitting: Vascular Surgery

## 2012-12-30 ENCOUNTER — Encounter: Payer: Self-pay | Admitting: Vascular Surgery

## 2012-12-30 VITALS — BP 130/65 | HR 84 | Resp 18 | Ht 67.0 in | Wt 175.0 lb

## 2012-12-30 DIAGNOSIS — N186 End stage renal disease: Secondary | ICD-10-CM

## 2012-12-30 NOTE — Progress Notes (Signed)
[facility] [place]  Postoperative Access Visit  History of Present Illness  CHIEF WALKUP is a 79 y.o. year old male who presents for postoperative follow-up for: T&R L FA AVG (Date: 12/16/12).  The patient's wounds are healing.  The patient notes recent swelling in upper arm after some exercise with both arms.  The patient denies steal symptoms.  The patient is able to complete their activities of daily living.  The patient's current symptoms are: none.  Physical Examination  Filed Vitals:   12/30/12 1346  BP: 130/65  Pulse: 84  Resp: 18   LUE: Distal incision healed, staples can be removed; upper arm is somewhat swollen without erythema or cellulitis, inc c/d/i, staples can be removed , skin feels warm, hand grip is 5/5, sensation in digits is intact, palpable thrill, bruit can be auscultated   Medical Decision Making  ALFONSE GARRINGER is a 79 y.o. year old male who presents s/p T&R L FA AVG.  It is not clear to me if this pt has a new hematoma in L upper arm, as its not appreciably bigger than when discharged from the hospital.  I don't see any benefit to re-exploring the L arm.    I expect the hematoma to resolve with time.  The patient's access will be ready for use in 2 weeks.  The patient's tunneled dialysis catheter can be removed after two successful cannulations and completed dialysis treatments.  Thank you for allowing [place] to participate in this patient's care.  Leonides Sake, MD [facility] [place] Office: [phone] Pager: [phone]

## 2013-01-29 ENCOUNTER — Encounter (HOSPITAL_COMMUNITY): Payer: Self-pay | Admitting: *Deleted

## 2013-01-29 ENCOUNTER — Other Ambulatory Visit: Payer: Self-pay

## 2013-01-31 MED ORDER — DEXTROSE 5 % IV SOLN
1.5000 g | INTRAVENOUS | Status: AC
  Administered 2013-02-01: 1.5 g via INTRAVENOUS
  Filled 2013-01-31: qty 1.5

## 2013-02-01 ENCOUNTER — Encounter (HOSPITAL_COMMUNITY): Payer: Self-pay | Admitting: Anesthesiology

## 2013-02-01 ENCOUNTER — Ambulatory Visit (HOSPITAL_COMMUNITY)
Admission: RE | Admit: 2013-02-01 | Discharge: 2013-02-01 | Disposition: A | Discharge status: Home / Self Care | Payer: Medicare Other | Source: Ambulatory Visit | Attending: Vascular Surgery | Admitting: Vascular Surgery

## 2013-02-01 ENCOUNTER — Ambulatory Visit (HOSPITAL_COMMUNITY): Payer: Medicare Other | Admitting: Anesthesiology

## 2013-02-01 ENCOUNTER — Encounter (HOSPITAL_COMMUNITY)
Admission: RE | Disposition: A | Discharge status: Home / Self Care | Payer: Self-pay | Source: Ambulatory Visit | Attending: Vascular Surgery

## 2013-02-01 ENCOUNTER — Ambulatory Visit (HOSPITAL_COMMUNITY): Payer: Medicare Other

## 2013-02-01 DIAGNOSIS — Z8673 Personal history of transient ischemic attack (TIA), and cerebral infarction without residual deficits: Secondary | ICD-10-CM | POA: Insufficient documentation

## 2013-02-01 DIAGNOSIS — I120 Hypertensive chronic kidney disease with stage 5 chronic kidney disease or end stage renal disease: Secondary | ICD-10-CM | POA: Insufficient documentation

## 2013-02-01 DIAGNOSIS — F329 Major depressive disorder, single episode, unspecified: Secondary | ICD-10-CM | POA: Insufficient documentation

## 2013-02-01 DIAGNOSIS — Y832 Surgical operation with anastomosis, bypass or graft as the cause of abnormal reaction of the patient, or of later complication, without mention of misadventure at the time of the procedure: Secondary | ICD-10-CM | POA: Insufficient documentation

## 2013-02-01 DIAGNOSIS — I252 Old myocardial infarction: Secondary | ICD-10-CM | POA: Insufficient documentation

## 2013-02-01 DIAGNOSIS — Z8701 Personal history of pneumonia (recurrent): Secondary | ICD-10-CM | POA: Insufficient documentation

## 2013-02-01 DIAGNOSIS — I2510 Atherosclerotic heart disease of native coronary artery without angina pectoris: Secondary | ICD-10-CM | POA: Insufficient documentation

## 2013-02-01 DIAGNOSIS — N186 End stage renal disease: Secondary | ICD-10-CM

## 2013-02-01 DIAGNOSIS — I82609 Acute embolism and thrombosis of unspecified veins of unspecified upper extremity: Secondary | ICD-10-CM | POA: Insufficient documentation

## 2013-02-01 DIAGNOSIS — E119 Type 2 diabetes mellitus without complications: Secondary | ICD-10-CM | POA: Insufficient documentation

## 2013-02-01 DIAGNOSIS — K219 Gastro-esophageal reflux disease without esophagitis: Secondary | ICD-10-CM | POA: Insufficient documentation

## 2013-02-01 DIAGNOSIS — T82898A Other specified complication of vascular prosthetic devices, implants and grafts, initial encounter: Secondary | ICD-10-CM | POA: Insufficient documentation

## 2013-02-01 DIAGNOSIS — F3289 Other specified depressive episodes: Secondary | ICD-10-CM | POA: Insufficient documentation

## 2013-02-01 HISTORY — PX: AV FISTULA PLACEMENT: SHX1204

## 2013-02-01 LAB — GLUCOSE, CAPILLARY
Glucose-Capillary: 97 mg/dL (ref 70–99)
Glucose-Capillary: 108 mg/dL — ABNORMAL HIGH (ref 70–99)

## 2013-02-01 LAB — POCT I-STAT 4, (NA,K, GLUC, HGB,HCT): Glucose, Bld: 127 mg/dL — ABNORMAL HIGH (ref 70–99)

## 2013-02-01 SURGERY — INSERTION OF ARTERIOVENOUS (AV) GORE-TEX GRAFT ARM
Anesthesia: General | Site: Arm Upper | Laterality: Left | Wound class: Clean

## 2013-02-01 MED ORDER — SODIUM CHLORIDE 0.9 % IR SOLN
Status: DC | PRN
  Administered 2013-02-01: 12:00:00

## 2013-02-01 MED ORDER — PHENYLEPHRINE HCL 10 MG/ML IJ SOLN
INTRAMUSCULAR | Status: DC | PRN
  Administered 2013-02-01: 40 ug via INTRAVENOUS

## 2013-02-01 MED ORDER — FENTANYL CITRATE 0.05 MG/ML IJ SOLN
INTRAMUSCULAR | Status: DC | PRN
  Administered 2013-02-01 (×4): 25 ug via INTRAVENOUS

## 2013-02-01 MED ORDER — PROTAMINE SULFATE 10 MG/ML IV SOLN
INTRAVENOUS | Status: DC | PRN
  Administered 2013-02-01 (×2): 25 mg via INTRAVENOUS
  Administered 2013-02-01: 5 mg via INTRAVENOUS
  Administered 2013-02-01: 25 mg via INTRAVENOUS

## 2013-02-01 MED ORDER — LIDOCAINE HCL (CARDIAC) 20 MG/ML IV SOLN
INTRAVENOUS | Status: DC | PRN
  Administered 2013-02-01: 60 mg via INTRAVENOUS

## 2013-02-01 MED ORDER — SODIUM CHLORIDE 0.9 % IV SOLN
INTRAVENOUS | Status: DC
  Administered 2013-02-01 (×2): via INTRAVENOUS

## 2013-02-01 MED ORDER — ONDANSETRON HCL 4 MG/2ML IJ SOLN
INTRAMUSCULAR | Status: DC | PRN
  Administered 2013-02-01: 4 mg via INTRAVENOUS

## 2013-02-01 MED ORDER — HEPARIN SODIUM (PORCINE) 1000 UNIT/ML IJ SOLN
INTRAMUSCULAR | Status: DC | PRN
  Administered 2013-02-01: 8000 [IU] via INTRAVENOUS

## 2013-02-01 MED ORDER — DEXTROSE 5 % IV SOLN
10.0000 mg | INTRAVENOUS | Status: DC | PRN
  Administered 2013-02-01: 25 ug/min via INTRAVENOUS

## 2013-02-01 MED ORDER — MUPIROCIN 2 % EX OINT
TOPICAL_OINTMENT | CUTANEOUS | Status: AC
  Filled 2013-02-01: qty 22

## 2013-02-01 MED ORDER — LIDOCAINE HCL (PF) 1 % IJ SOLN
INTRAMUSCULAR | Status: AC
  Filled 2013-02-01: qty 30

## 2013-02-01 MED ORDER — 0.9 % SODIUM CHLORIDE (POUR BTL) OPTIME
TOPICAL | Status: DC | PRN
  Administered 2013-02-01: 1000 mL

## 2013-02-01 MED ORDER — MUPIROCIN 2 % EX OINT
TOPICAL_OINTMENT | Freq: Two times a day (BID) | CUTANEOUS | Status: DC
  Filled 2013-02-01: qty 22

## 2013-02-01 MED ORDER — FENTANYL CITRATE 0.05 MG/ML IJ SOLN: 25.0000 ug | INTRAMUSCULAR | Status: DC | PRN

## 2013-02-01 MED ORDER — OXYCODONE HCL 5 MG/5ML PO SOLN: 5.0000 mg | Freq: Once | ORAL | Status: DC | PRN

## 2013-02-01 MED ORDER — MIDAZOLAM HCL 5 MG/5ML IJ SOLN
INTRAMUSCULAR | Status: DC | PRN
  Administered 2013-02-01: 1 mg via INTRAVENOUS

## 2013-02-01 MED ORDER — OXYCODONE HCL 5 MG PO TABS: 5.0000 mg | ORAL_TABLET | ORAL | Status: DC | PRN

## 2013-02-01 MED ORDER — THROMBIN 20000 UNITS EX SOLR
CUTANEOUS | Status: AC
  Filled 2013-02-01: qty 20000

## 2013-02-01 MED ORDER — PROPOFOL 10 MG/ML IV BOLUS
INTRAVENOUS | Status: DC | PRN
  Administered 2013-02-01: 150 mg via INTRAVENOUS

## 2013-02-01 MED ORDER — OXYCODONE HCL 5 MG PO TABS: 5.0000 mg | ORAL_TABLET | Freq: Once | ORAL | Status: DC | PRN

## 2013-02-01 MED FILL — Fentanyl Citrate Inj 0.05 MG/ML: INTRAMUSCULAR | Qty: 1 | Status: AC

## 2013-02-01 SURGICAL SUPPLY — 43 items
ARMBAND PINK RESTRICT EXTREMIT (MISCELLANEOUS) ×3 IMPLANT
CANISTER SUCTION 2500CC (MISCELLANEOUS) ×3 IMPLANT
CATH EMB 4FR 80CM (CATHETERS) ×3 IMPLANT
CLIP TI MEDIUM 6 (CLIP) ×3 IMPLANT
CLIP TI WIDE RED SMALL 6 (CLIP) ×6 IMPLANT
CLOTH BEACON ORANGE TIMEOUT ST (SAFETY) ×3 IMPLANT
COVER SURGICAL LIGHT HANDLE (MISCELLANEOUS) ×3 IMPLANT
DECANTER SPIKE VIAL GLASS SM (MISCELLANEOUS) IMPLANT
DERMABOND ADVANCED (GAUZE/BANDAGES/DRESSINGS) ×1
DERMABOND ADVANCED .7 DNX12 (GAUZE/BANDAGES/DRESSINGS) ×2 IMPLANT
DRAPE X-RAY CASS 24X20 (DRAPES) IMPLANT
ELECT REM PT RETURN 9FT ADLT (ELECTROSURGICAL) ×3
ELECTRODE REM PT RTRN 9FT ADLT (ELECTROSURGICAL) ×2 IMPLANT
GEL ULTRASOUND 20GR AQUASONIC (MISCELLANEOUS) IMPLANT
GLOVE BIO SURGEON STRL SZ7.5 (GLOVE) ×3 IMPLANT
GLOVE BIOGEL PI IND STRL 6.5 (GLOVE) ×6 IMPLANT
GLOVE BIOGEL PI IND STRL 7.0 (GLOVE) ×2 IMPLANT
GLOVE BIOGEL PI INDICATOR 6.5 (GLOVE) ×3
GLOVE BIOGEL PI INDICATOR 7.0 (GLOVE) ×1
GLOVE ECLIPSE 6.5 STRL STRAW (GLOVE) ×3 IMPLANT
GLOVE SURG SS PI 6.5 STRL IVOR (GLOVE) ×3 IMPLANT
GOWN PREVENTION PLUS XLARGE (GOWN DISPOSABLE) ×3 IMPLANT
GOWN STRL NON-REIN LRG LVL3 (GOWN DISPOSABLE) ×6 IMPLANT
GRAFT GORETEX STRT 6X50 (Vascular Products) ×3 IMPLANT
KIT BASIN OR (CUSTOM PROCEDURE TRAY) ×3 IMPLANT
KIT ROOM TURNOVER OR (KITS) ×3 IMPLANT
LOOP VESSEL MINI RED (MISCELLANEOUS) ×3 IMPLANT
NS IRRIG 1000ML POUR BTL (IV SOLUTION) ×3 IMPLANT
PACK CV ACCESS (CUSTOM PROCEDURE TRAY) ×3 IMPLANT
PAD ARMBOARD 7.5X6 YLW CONV (MISCELLANEOUS) ×6 IMPLANT
SET COLLECT BLD 21X3/4 12 (NEEDLE) IMPLANT
SPONGE SURGIFOAM ABS GEL 100 (HEMOSTASIS) IMPLANT
STOPCOCK 4 WAY LG BORE MALE ST (IV SETS) IMPLANT
SUT PROLENE 6 0 CC (SUTURE) ×6 IMPLANT
SUT VIC AB 3-0 SH 27 (SUTURE) ×2
SUT VIC AB 3-0 SH 27X BRD (SUTURE) ×4 IMPLANT
SUT VIC AB 4-0 PS2 27 (SUTURE) ×3 IMPLANT
SUT VICRYL 4-0 PS2 18IN ABS (SUTURE) ×3 IMPLANT
TOWEL OR 17X24 6PK STRL BLUE (TOWEL DISPOSABLE) ×3 IMPLANT
TOWEL OR 17X26 10 PK STRL BLUE (TOWEL DISPOSABLE) ×3 IMPLANT
TUBING EXTENTION W/L.L. (IV SETS) IMPLANT
UNDERPAD 30X30 INCONTINENT (UNDERPADS AND DIAPERS) ×3 IMPLANT
WATER STERILE IRR 1000ML POUR (IV SOLUTION) ×3 IMPLANT

## 2013-02-01 NOTE — Progress Notes (Signed)
Patient's dialysis facility notified of procedure today. PA paged and returned page, patient should return to regular schedule for dialysis tomorrow as scheduled. Patient has no complaints of pain or discomfort at this time.

## 2013-02-01 NOTE — Transfer of Care (Signed)
Immediate Anesthesia Transfer of Care Note  Patient: Shane [name]  Procedure(s) Performed: Procedure(s): INSERTION OF ARTERIOVENOUS (AV) GORE-TEX GRAFT ARM (Left)  Patient Location: PACU  Anesthesia Type:General  Level of Consciousness: awake, alert  and oriented  Airway & Oxygen Therapy: Patient Spontanous Breathing and Patient connected to face mask oxygen  Post-op Assessment: Report given to PACU RN, Post -op Vital signs reviewed and stable and Patient moving all extremities X 4  Post vital signs: Reviewed and stable  Complications: No apparent anesthesia complications

## 2013-02-01 NOTE — Progress Notes (Signed)
02/01/13 0904  OBSTRUCTIVE SLEEP APNEA  Have you ever been diagnosed with sleep apnea through a sleep study? No  Do you snore loudly (loud enough to be heard through closed doors)?  0  Do you often feel tired, fatigued, or sleepy during the daytime? 0  Has anyone observed you stop breathing during your sleep? 1  Do you have, or are you being treated for high blood pressure? 1  BMI more than 35 kg/m2? 0  Age over 77 years old? 1  Neck circumference greater than 40 cm/18 inches? 0  Gender: 1  Obstructive Sleep Apnea Score 4  Score 4 or greater  Results sent to PCP

## 2013-02-01 NOTE — Anesthesia Preprocedure Evaluation (Addendum)
Anesthesia Evaluation  Patient identified by MRN, date of birth, ID band Patient awake    Reviewed: Allergy & Precautions, H&P , NPO status , Patient's Chart, lab work & pertinent test results, reviewed documented beta blocker date and time   Airway Mallampati: II TM Distance: >3 FB Neck ROM: Full    Dental  (+) Edentulous Upper and Edentulous Lower   Pulmonary shortness of breath and with exertion,  breath sounds clear to auscultation  Pulmonary exam normal       Cardiovascular hypertension, + CAD and + Past MI Rhythm:Regular Rate:Normal     Neuro/Psych Depression TIA   GI/Hepatic hiatal hernia, GERD-  Medicated and Controlled,  Endo/Other  diabetes, Well Controlled, Type 2  Renal/GU ESRF and DialysisRenal disease     Musculoskeletal   Abdominal Normal abdominal exam  (+)   Peds  Hematology   Anesthesia Other Findings   Reproductive/Obstetrics                          Anesthesia Physical Anesthesia Plan  ASA: III  Anesthesia Plan: General   Post-op Pain Management:    Induction: Intravenous  Airway Management Planned: LMA  Additional Equipment:   Intra-op Plan:   Post-operative Plan: Extubation in OR  Informed Consent: I have reviewed the patients History and Physical, chart, labs and discussed the procedure including the risks, benefits and alternatives for the proposed anesthesia with the patient or authorized representative who has indicated his/her understanding and acceptance.   Dental advisory given  Plan Discussed with: CRNA and Surgeon  Anesthesia Plan Comments:        Anesthesia Quick Evaluation

## 2013-02-01 NOTE — Progress Notes (Signed)
Reinforced use of mupirocin for 9 more doses.

## 2013-02-01 NOTE — H&P (Signed)
  [facility] SHORT STAY H&P  CC:  Clotted left arm graft  HPI: Left forearm graft.  Thrombectomy and revision to very high left upper arm in April by Dr Imogene Burn.  Pt now has clotted the graft again.  He has a right side dialysis catheter  Past Medical History  Diagnosis Date  . Coronary artery disease   . Myocardial infarction     according to patient  . CVA (cerebral vascular accident)     2 mini strokes  . Arthritis   . GERD (gastroesophageal reflux disease)   . H/O hiatal hernia   . Memory loss   . Hypertension     not on medication  . Shortness of breath     with exertion  . Diabetes mellitus     not on medications  . Depression   . Pneumonia     stayed at [facility]  . Chronic kidney disease     on kidney transplant list  . ESRD (end stage renal disease)     Dialysis T/T/S    FH:  Non-Contributory  Social HX History  Substance Use Topics  . Smoking status: Never Smoker   . Smokeless tobacco: Former Neurosurgeon    Types: Chew    Quit date: 08/26/2009  . Alcohol Use: No    Allergies No Known Allergies  Medications Current Facility-Administered Medications  Medication Dose Route Frequency Provider Last Rate Last Dose  . 0.9 %  sodium chloride infusion   Intravenous Continuous Sherren Kerns, MD 20 mL/hr at 02/01/13 1112    . 0.9 % irrigation (POUR BTL)    PRN Sherren Kerns, MD   1,000 mL at 02/01/13 1203  . cefUROXime (ZINACEF) 1.5 g in dextrose 5 % 50 mL IVPB  1.5 g Intravenous 30 min Pre-Op Sherren Kerns, MD      . fentaNYL (SUBLIMAZE) injection 25-50 mcg  25-50 mcg Intravenous Q5 min PRN Bedelia Person, MD      . heparin 6,000 Units in sodium chloride irrigation 0.9 % 500 mL irrigation    PRN Sherren Kerns, MD      . mupirocin ointment (BACTROBAN) 2 %   Nasal BID Sherren Kerns, MD      . mupirocin ointment (BACTROBAN) 2 %           . oxyCODONE (Oxy IR/ROXICODONE) immediate release tablet 5 mg  5 mg Oral Once PRN Bedelia Person, MD       Or  .  oxyCODONE (ROXICODONE) 5 MG/5ML solution 5 mg  5 mg Oral Once PRN Bedelia Person, MD        PHYSICAL EXAM  Filed Vitals:   02/01/13 0856  BP: 167/87  Pulse: 73  Temp: 98.1 F (36.7 C)  Resp: 16    General:  WDWN in NAD HENT: WNL Eyes: Pupils equal Pulmonary: normal non-labored breathing , without Rales, rhonchi,  wheezing Cardiac: RRR, Vascular Exam/Pulses:  2+ left radial pulse, occluded forearm graft Extremities without ischemic changes, no Gangrene , no cellulitis; no open wounds;  Neuro A&O x 3; good sensation; motion in all extremities  Impression: Thrombosed left arm AV graft which is a forearm graft that has now been extended all the way up the axilla.  Plan: New left arm graft today in upper arm   Shane [name] E @TODAY @ 12:37 PM

## 2013-02-01 NOTE — Anesthesia Procedure Notes (Signed)
Procedure Name: LMA Insertion Date/Time: 02/01/2013 1:01 PM Performed by: Elon Alas Pre-anesthesia Checklist: Patient identified, Emergency Drugs available, Suction available, Patient being monitored and Timeout performed Patient Re-evaluated:Patient Re-evaluated prior to inductionOxygen Delivery Method: Circle system utilized Preoxygenation: Pre-oxygenation with 100% oxygen Intubation Type: IV induction Ventilation: Mask ventilation without difficulty LMA: LMA with gastric port inserted LMA Size: 5.0 Placement Confirmation: positive ETCO2 and breath sounds checked- equal and bilateral Tube secured with: Tape Dental Injury: Teeth and Oropharynx as per pre-operative assessment

## 2013-02-01 NOTE — Preoperative (Signed)
Beta Blockers   Reason not to administer Beta Blockers:Not Applicable 

## 2013-02-01 NOTE — Anesthesia Postprocedure Evaluation (Signed)
  Anesthesia Post-op Note  Patient: Shane [name]  Procedure(s) Performed: Procedure(s): INSERTION OF ARTERIOVENOUS (AV) GORE-TEX GRAFT ARM (Left)  Patient Location: PACU  Anesthesia Type:General  Level of Consciousness: awake  Airway and Oxygen Therapy: Patient Spontanous Breathing  Post-op Pain: mild  Post-op Assessment: Post-op Vital signs reviewed, Patient's Cardiovascular Status Stable, Respiratory Function Stable, Patent Airway, No signs of Nausea or vomiting and Pain level controlled  Post-op Vital Signs: stable  Complications: No apparent anesthesia complications

## 2013-02-01 NOTE — Op Note (Signed)
Procedure: Left Upper Arm AV graft  Preop: ESRD  Postop: ESRD  Anesthesia: General  Assistant: Della Goo, PA-C  Findings:6 mm PTFE end to end to axillary vein   Procedure Details: The left upper extremity was prepped and draped in usual sterile fashion.  A longitudinal incision was then made near the antecubital crease the left arm. The incision was carried into the subcutaneous tissues down to level of the brachial artery.  There was dense scar in this area from prior access procedures.   Next the brachial artery was dissected free in the medial portion incision. The artery was  3-4 mm in diameter. The vessel loops were placed proximal and distal to the planned site of arteriotomy. At this point, a longitudinal incision was made in the axilla and carried through the subcutaneous tissues and fascia to expose the axillary vein.  The nerves were protected.  There was a pre-existing graft in this location and this was followed up to where the axillary vein once again became patent.  The vein was approximately 4-5 mm in diameter.  It was dissected free and small side branches controlled with vessel loops.  Next, a subcutaneous tunnel was created connecting the upper arm to the lower arm incision in an arcing configuration over the biceps muscle.  A 6 mm PTFE graft was then brought through this subcutaneous tunnel. The patient was given 8000 units of intravenous heparin. After appropriate circulation time, the vessel loops were used to control the artery. A longitudinal opening was made in the left brachial artery.  The end of the graft was beveled and sewn end to side to the artery using a 6 0 prolene.  At completion of the anastomosis the artery was forward bled, backbled and thoroughly flushed.  The anastomosis was secured, vessel loops were released and there was palpable pulse in the graft.  The graft was clamped just above the arterial anastomosis with a fistula clamp. The graft was then pulled  taut to length at the axillary incision.  The axillary vein was controlled with a fine bulldog clamp on a large side branch and a henley clamp in the upper axilla.  The vein was transected and spatulated.  The distal end of the graft was then beveled and sewn end to end to the vein using a running 6 0 prolene.  Just prior to completion of the anastomosis, everything was forward bled, back bled and thoroughly flushed.  The anastomosis was secured and the fistula clamp removed from the proximal graft.  A thrill was immediately palpable in the graft. The patient was given 80 mg of protamine to assist with hemostasis.  After hemostasis was obtained, the subcutaneous tissues were reapproximated using a running 3-0 Vicryl suture. The skin was then closed with a 4 0 Vicryl subcuticular stitch. Dermabond was applied to the skin incisions.  The patient tolerated the procedure well and there were no complications.  Instrument sponge and needle count was correct at the end of the case.  The patient was taken to the recovery room in stable condition.  The patient had a palpable radial pulse at the end of the case.  Fabienne Bruns, MD [facility] [place] Office: [phone] Pager: [phone]

## 2013-02-02 ENCOUNTER — Encounter (HOSPITAL_COMMUNITY): Payer: Self-pay | Admitting: Vascular Surgery

## 2013-03-07 ENCOUNTER — Emergency Department (HOSPITAL_COMMUNITY): Payer: Medicare Other

## 2013-03-07 ENCOUNTER — Encounter (HOSPITAL_COMMUNITY): Payer: Self-pay | Admitting: *Deleted

## 2013-03-07 ENCOUNTER — Emergency Department (HOSPITAL_COMMUNITY)
Admission: EM | Admit: 2013-03-07 | Discharge: 2013-03-07 | Disposition: A | Discharge status: Home Health | Payer: Medicare Other | Attending: Emergency Medicine | Admitting: Emergency Medicine

## 2013-03-07 DIAGNOSIS — R5383 Other fatigue: Secondary | ICD-10-CM | POA: Insufficient documentation

## 2013-03-07 DIAGNOSIS — G40909 Epilepsy, unspecified, not intractable, without status epilepticus: Secondary | ICD-10-CM | POA: Insufficient documentation

## 2013-03-07 DIAGNOSIS — I252 Old myocardial infarction: Secondary | ICD-10-CM | POA: Insufficient documentation

## 2013-03-07 DIAGNOSIS — R34 Anuria and oliguria: Secondary | ICD-10-CM | POA: Insufficient documentation

## 2013-03-07 DIAGNOSIS — K219 Gastro-esophageal reflux disease without esophagitis: Secondary | ICD-10-CM | POA: Insufficient documentation

## 2013-03-07 DIAGNOSIS — F3289 Other specified depressive episodes: Secondary | ICD-10-CM | POA: Insufficient documentation

## 2013-03-07 DIAGNOSIS — Z992 Dependence on renal dialysis: Secondary | ICD-10-CM | POA: Insufficient documentation

## 2013-03-07 DIAGNOSIS — I120 Hypertensive chronic kidney disease with stage 5 chronic kidney disease or end stage renal disease: Secondary | ICD-10-CM | POA: Insufficient documentation

## 2013-03-07 DIAGNOSIS — M129 Arthropathy, unspecified: Secondary | ICD-10-CM | POA: Insufficient documentation

## 2013-03-07 DIAGNOSIS — Z8673 Personal history of transient ischemic attack (TIA), and cerebral infarction without residual deficits: Secondary | ICD-10-CM | POA: Insufficient documentation

## 2013-03-07 DIAGNOSIS — I2510 Atherosclerotic heart disease of native coronary artery without angina pectoris: Secondary | ICD-10-CM | POA: Insufficient documentation

## 2013-03-07 DIAGNOSIS — R413 Other amnesia: Secondary | ICD-10-CM | POA: Insufficient documentation

## 2013-03-07 DIAGNOSIS — N186 End stage renal disease: Secondary | ICD-10-CM | POA: Insufficient documentation

## 2013-03-07 DIAGNOSIS — Z7902 Long term (current) use of antithrombotics/antiplatelets: Secondary | ICD-10-CM | POA: Insufficient documentation

## 2013-03-07 DIAGNOSIS — R531 Weakness: Secondary | ICD-10-CM

## 2013-03-07 DIAGNOSIS — F329 Major depressive disorder, single episode, unspecified: Secondary | ICD-10-CM | POA: Insufficient documentation

## 2013-03-07 DIAGNOSIS — R5381 Other malaise: Secondary | ICD-10-CM | POA: Insufficient documentation

## 2013-03-07 DIAGNOSIS — Z79899 Other long term (current) drug therapy: Secondary | ICD-10-CM | POA: Insufficient documentation

## 2013-03-07 DIAGNOSIS — E119 Type 2 diabetes mellitus without complications: Secondary | ICD-10-CM | POA: Insufficient documentation

## 2013-03-07 DIAGNOSIS — Z8719 Personal history of other diseases of the digestive system: Secondary | ICD-10-CM | POA: Insufficient documentation

## 2013-03-07 DIAGNOSIS — Z951 Presence of aortocoronary bypass graft: Secondary | ICD-10-CM | POA: Insufficient documentation

## 2013-03-07 DIAGNOSIS — Z8701 Personal history of pneumonia (recurrent): Secondary | ICD-10-CM | POA: Insufficient documentation

## 2013-03-07 LAB — COMPREHENSIVE METABOLIC PANEL
AST: 19 U/L (ref 0–37)
Albumin: 3.2 g/dL — ABNORMAL LOW (ref 3.5–5.2)
Alkaline Phosphatase: 87 U/L (ref 39–117)
BUN: 47 mg/dL — ABNORMAL HIGH (ref 6–23)
Chloride: 97 mEq/L (ref 96–112)
Creatinine, Ser: 7.48 mg/dL — ABNORMAL HIGH (ref 0.50–1.35)
Potassium: 3.8 mEq/L (ref 3.5–5.1)
Total Bilirubin: 0.3 mg/dL (ref 0.3–1.2)
Total Protein: 7.4 g/dL (ref 6.0–8.3)

## 2013-03-07 LAB — CBC
MCHC: 32.5 g/dL (ref 30.0–36.0)
Platelets: 128 10*3/uL — ABNORMAL LOW (ref 150–400)
RDW: 15.5 % (ref 11.5–15.5)
WBC: 4.3 10*3/uL (ref 4.0–10.5)

## 2013-03-07 LAB — POCT I-STAT TROPONIN I: Troponin i, poc: 0.02 ng/mL (ref 0.00–0.08)

## 2013-03-07 LAB — GLUCOSE, CAPILLARY: Glucose-Capillary: 146 mg/dL — ABNORMAL HIGH (ref 70–99)

## 2013-03-07 NOTE — ED Notes (Signed)
Patient's wife states he has ben having seizures and mini strokes since January 2014 and patient had a seizure last evening and experiencing weak spells today. Patient placed in hospital gown and [laced on cardiac monitor and pulse ox.  Patient is a dialysis patient with dialysis treatments on Tuesday/ Thursday/Saturday with his last treatment Saturday (yesterday). Dialysis catheter is in patient right chest and dialysis in left forearm with audible B/T. Patient's wife at bedside.

## 2013-03-07 NOTE — ED Notes (Signed)
Pt made aware that we need a urine sample.

## 2013-03-07 NOTE — ED Notes (Signed)
Pt went home yesterday and slept after HD and never got his energy back, pt did not sleep at all. Pt felt weak this am, went to get out of car and he was holding to building.  CBG 146 at triage.  Pt has had this happen three times today.

## 2013-03-07 NOTE — ED Notes (Signed)
Pt cont to be unable to void.  Family members at bedside. Pt states he feels "good, and is ready to go home"

## 2013-03-07 NOTE — ED Provider Notes (Signed)
History    CSN: [id number] Arrival date & time 03/07/13  1710  First MD Initiated Contact with Patient 03/07/13 1724     Chief Complaint  Patient presents with  . Weakness   (Consider location/radiation/quality/duration/timing/severity/associated sxs/prior Treatment) HPI Comments: The patient is a 79 year old man with end-stage renal disease. His wife says that he has had a couple of episodes of weakness. He's had prior mini strokes. Today he was in church, and stood up to walk out, and can only walk about 2 years before he had to sit down. His wife went to the car, in church members walk in a car. The wife says he has a l loss of energy. Also she says she doesn't sleep well at night moving around constantly. He has a right subclavian dialysis catheter, and a left forearm AV graft is maturing.  Patient is a 79 y.o. male presenting with weakness. The history is provided by the patient, the spouse and medical records. No language interpreter was used.  Weakness The current episode started 1 to 2 hours ago. The problem occurs constantly. The problem has not changed since onset.Pertinent negatives include no chest pain, no abdominal pain, no headaches and no shortness of breath. Nothing aggravates the symptoms. Nothing relieves the symptoms. Treatments tried: Patient brought to [facility] via private car by family.   Past Medical History  Diagnosis Date  . Coronary artery disease   . Myocardial infarction     according to patient  . CVA (cerebral vascular accident)     2 mini strokes  . Arthritis   . GERD (gastroesophageal reflux disease)   . H/O hiatal hernia   . Memory loss   . Hypertension     not on medication  . Shortness of breath     with exertion  . Diabetes mellitus     not on medications  . Depression   . Pneumonia     stayed at [facility]  . Chronic kidney disease     on kidney transplant list  . ESRD (end stage renal disease)     Dialysis T/T/S   Past Surgical  History  Procedure Laterality Date  . Dialysis fistula creation    . Coronary artery bypass graft  2002  . Av fistula placement  01/16/2012    Left BVT   . Venoplasty  04/06/2012    Left Basilic Vein Stenosis  . Av fistula placement Left 10/02/2012    Procedure: INSERTION OF ARTERIOVENOUS (AV) GORE-TEX GRAFT ARM;  Surgeon: [name], MD;  Location: [facility] OR;  Service: Vascular;  Laterality: Left;  . Thrombectomy and revision of arterioventous (av) goretex  graft Left 12/16/2012    Procedure: THROMBECTOMY AND REVISION OF ARTERIOVENTOUS (AV) GORETEX  GRAFT;  Surgeon: Fransisco Hertz, MD;  Location: [facility] OR;  Service: Vascular;  Laterality: Left;  Ultrasound guided  . Arteriovenous graft placement    . Laparoscopic partial hepatectomy Right 07/30/12@ [facility]- notes are  in [place]  . Av fistula placement Left 02/01/2013    Procedure: INSERTION OF ARTERIOVENOUS (AV) GORE-TEX GRAFT ARM;  Surgeon: Sherren Kerns, MD;  Location: [facility] OR;  Service: Vascular;  Laterality: Left;   Family History  Problem Relation Age of Onset  . Diabetes    . Kidney disease    . Heart disease    . Stroke     History  Substance Use Topics  . Smoking status: Never Smoker   . Smokeless tobacco: Former Neurosurgeon  Types: Dorna Bloom    Quit date: 08/26/2009  . Alcohol Use: No    Review of Systems  Constitutional: Negative for fever and chills.  HENT: Negative.   Eyes: Negative.   Respiratory: Negative.  Negative for shortness of breath.   Cardiovascular: Negative for chest pain, palpitations and leg swelling.  Gastrointestinal: Negative.  Negative for abdominal pain.  Genitourinary:       He urinates very little.  Musculoskeletal: Negative.   Neurological: Positive for weakness. Negative for headaches.  Psychiatric/Behavioral: Negative.     Allergies  Review of patient's allergies indicates no known allergies.  Home Medications   Current Outpatient Rx  Name  Route  Sig  Dispense  Refill  . allopurinol  (ZYLOPRIM) 100 MG tablet   Oral   Take 100 mg by mouth daily.          Marland Kitchen atorvastatin (LIPITOR) 40 MG tablet   Oral   Take 40 mg by mouth daily.         . benzonatate (TESSALON) 200 MG capsule   Oral   Take 200 mg by mouth 3 (three) times daily as needed for cough.         . calcitRIOL (ROCALTROL) 0.5 MCG capsule   Oral   Take 0.5 mcg by mouth daily.          . citalopram (CELEXA) 40 MG tablet   Oral   Take by mouth daily.          . clopidogrel (PLAVIX) 75 MG tablet   Oral   Take 75 mg by mouth daily.         . divalproex (DEPAKOTE ER) 500 MG 24 hr tablet   Oral   Take 1 tablet (500 mg total) by mouth daily.   30 tablet   3   . finasteride (PROSCAR) 5 MG tablet   Oral   Take 5 mg by mouth daily.          . midodrine (PROAMATINE) 5 MG tablet   Oral   Take by mouth 2 (two) times daily.          . multivitamin (RENA-VIT) TABS tablet   Oral   Take 1 tablet by mouth daily.   30 tablet   0   . NAMENDA 10 MG tablet   Oral   Take 10 mg by mouth 2 (two) times daily.          . Nutritional Supplements (FEEDING SUPPLEMENT, NEPRO CARB STEADY,) LIQD   Oral   Take 237 mLs by mouth 2 (two) times daily between meals.   60 Can   0   . oxyCODONE (ROXICODONE) 5 MG immediate release tablet   Oral   Take 1-2 tablets (5-10 mg total) by mouth every 4 (four) hours as needed for pain.   30 tablet   0   . ranitidine (ZANTAC) 150 MG tablet   Oral   Take 150 mg by mouth 2 (two) times daily.          BP 120/55  Pulse 74  Temp(Src) 98.2 F (36.8 C) (Oral)  Resp 20  SpO2 98% Physical Exam  Nursing note and vitals reviewed. Constitutional: He is oriented to person, place, and time. He appears well-developed and well-nourished. No distress.  HENT:  Head: Normocephalic and atraumatic.  Right Ear: External ear normal.  Left Ear: External ear normal.  Mouth/Throat: Oropharynx is clear and moist.  Eyes: Conjunctivae and EOM are normal. Pupils are equal,  round,  and reactive to light.  Neck: Normal range of motion. Neck supple.  Cardiovascular: Normal rate, regular rhythm and normal heart sounds.   Pulmonary/Chest: Effort normal and breath sounds normal.  Abdominal: Soft. Bowel sounds are normal.  Musculoskeletal: Normal range of motion.  Neurological: He is alert and oriented to person, place, and time.  Skin: Skin is warm and dry.  Psychiatric: He has a normal mood and affect. His behavior is normal.    ED Course  Procedures (including critical care time) Labs Reviewed  GLUCOSE, CAPILLARY - Abnormal; Notable for the following:    Glucose-Capillary 146 (*)    All other components within normal limits  CBC  COMPREHENSIVE METABOLIC PANEL  URINALYSIS, ROUTINE W REFLEX MICROSCOPIC    5:44 PM  Date: 03/07/2013  Rate: 74  Rhythm: normal sinus rhythm  QRS Axis: normal  Intervals: QT prolonged QRS:  Q waves in inferior leads suggest old inferior myocardial infarction.   Left ventricular hypertrophy.  ST/T Wave abnormalities: nonspecific T wave changes  Conduction Disutrbances:right bundle branch block  Narrative Interpretation: Abnormal EKG  Old EKG Reviewed: unchanged  8:04 PM Results for orders placed during the hospital encounter of 03/07/13  GLUCOSE, CAPILLARY      Result Value Range   Glucose-Capillary 146 (*) 70 - 99 mg/dL  CBC      Result Value Range   WBC 4.3  4.0 - 10.5 K/uL   RBC 3.75 (*) 4.22 - 5.81 MIL/uL   Hemoglobin 11.9 (*) 13.0 - 17.0 g/dL   HCT 47.8 (*) 29.5 - 62.1 %   MCV 97.6  78.0 - 100.0 fL   MCH 31.7  26.0 - 34.0 pg   MCHC 32.5  30.0 - 36.0 g/dL   RDW 30.8  65.7 - 84.6 %   Platelets 128 (*) 150 - 400 K/uL  COMPREHENSIVE METABOLIC PANEL      Result Value Range   Sodium 136  135 - 145 mEq/L   Potassium 3.8  3.5 - 5.1 mEq/L   Chloride 97  96 - 112 mEq/L   CO2 25  19 - 32 mEq/L   Glucose, Bld 133 (*) 70 - 99 mg/dL   BUN 47 (*) 6 - 23 mg/dL   Creatinine, Ser 9.62 (*) 0.50 - 1.35 mg/dL   Calcium 9.5   8.4 - 95.2 mg/dL   Total Protein 7.4  6.0 - 8.3 g/dL   Albumin 3.2 (*) 3.5 - 5.2 g/dL   AST 19  0 - 37 U/L   ALT 13  0 - 53 U/L   Alkaline Phosphatase 87  39 - 117 U/L   Total Bilirubin 0.3  0.3 - 1.2 mg/dL   GFR calc non Af Amer 6 (*) >90 mL/min   GFR calc Af Amer 7 (*) >90 mL/min  POCT I-STAT TROPONIN I      Result Value Range   Troponin i, poc 0.02  0.00 - 0.08 ng/mL   Comment 3            Ct Head Wo Contrast  03/07/2013   *RADIOLOGY REPORT*  Clinical Data: Diffusion weakness 1 day.  CT HEAD WITHOUT CONTRAST  Technique:  Contiguous axial images were obtained from the base of the skull through the vertex without contrast.  Comparison: 09/09/2012.  Findings:  Calvarium: No acute osseous abnormality.  No lytic or blastic lesion.  Orbits: No acute abnormality.  Brain: No evidence of acute abnormality, such as acute infarction, hemorrhage, hydrocephalus, or mass lesion/mass effect.Senescent changes including  cerebral volume loss and patchy bilateral cerebral white matter low attenuation, consistent with chronic small vessel ischemia.  Extensive intracranial calcific atherosclerosis.  IMPRESSION: 1. No CT evidence of acute intracranial abnormality. 2.  Senescent brain changes, stable from 09/09/2012.   Original Report Authenticated By: Tiburcio Pea    8:29 PM  Review of old charts shows that he had a similar episode to this in January, and was not found to have a stroke at that time. Today's laboratory tests laboratory testing as reassuringly negative. I advised the patient and his wife and daughter that it is safe for him to go home and he can follow up with his neurologist in [place], Dr. Adella Hare. He should continue to take his seizure medications, Depakote 500 mg daily as an extended release pill..       1. Weakness   2. Seizure disorder   3. End stage renal failure on dialysis      Carleene Cooper III, MD 03/07/13 2033

## 2013-04-14 ENCOUNTER — Encounter: Payer: Self-pay | Admitting: Vascular Surgery

## 2013-04-15 ENCOUNTER — Encounter: Payer: Self-pay | Admitting: Vascular Surgery

## 2013-04-15 ENCOUNTER — Ambulatory Visit (INDEPENDENT_AMBULATORY_CARE_PROVIDER_SITE_OTHER): Payer: Medicare Other | Admitting: Vascular Surgery

## 2013-04-15 ENCOUNTER — Encounter (INDEPENDENT_AMBULATORY_CARE_PROVIDER_SITE_OTHER): Payer: Medicare Other | Admitting: Vascular Surgery

## 2013-04-15 VITALS — BP 144/63 | HR 81 | Resp 18 | Ht 67.0 in | Wt 179.0 lb

## 2013-04-15 DIAGNOSIS — Z4931 Encounter for adequacy testing for hemodialysis: Secondary | ICD-10-CM

## 2013-04-15 DIAGNOSIS — N186 End stage renal disease: Secondary | ICD-10-CM

## 2013-04-15 NOTE — Progress Notes (Signed)
Patient is a 79 year old male who recently had a left upper arm AV graft placed 02/01/2013. He is referred today by Dr. Juel Burrow for consideration of Superficialization or replacement of the graft to a more superficial position new to difficulty cannulating the graft. I also spoke with the charge nurse of his dialysis center who stated they have had some difficulty cannulating the graft. Apparently the patient recently had a shuntogram by Dr. Juel Burrow. The report or films of this recent shuntogram were unavailable for review today. These were not forwarded with Dr. Truman Hayward request for consultation.  Physical exam:  Filed Vitals:   04/15/13 1340  BP: 144/63  Pulse: 81  Resp: 18  Height: 5\' 7"  (1.702 m)  Weight: 179 lb (81.194 kg)   Left upper extremity: Easily palpable thrill in graft the graft is easily palpable and visible on the skin surface over its proximal 50%. The graft is slightly deeper but still palpable in the upper portion of the arm. Both incisions are well-healed.  Data: A duplex ultrasound of the graft was performed today. This showed some venous anastomotic narrowing. The graft is 5-7 mm from the skin surface throughout its entire course. I reviewed and interpreted this study.  Assessment: Difficult to cannulate left upper arm AV graft. I discussed these issues with the patient in the charge nurse at his dialysis center. The graft is On average 6 mm from the skin surface an easily palpable. I'm not sure why they're having difficulty cannulating this. However, I did discuss with the charge nurse that if she has continued difficulty sticking the graft and we could bring him back for revision of the graft and tried to place the graft less than 6 mm from the surface of the skin to see if it would be possible to cannulate it in this location.  Plan: Schedule for revision of left upper arm AV graft in 2 weeks if they're still having difficulty cannulating the graft.  Fabienne Bruns, MD [facility] Office: [phone] Pager: [phone]

## 2013-10-05 IMAGING — CT CT HEAD W/O CM
1 series · 16 of 30 positions shown, 20 images · non-contrast
Comparison: 09/09/2012.

CLINICAL DATA: Diffusion weakness 1 day.

CT HEAD WITHOUT CONTRAST
TECHNIQUE: Contiguous axial images were obtained from the base of
the skull through the vertex without contrast.

[Series 2: head 5.0 h30s · axial · 0.44mm/px · z∈[-171,-36]mm · 16 of 31 slices shown, 20 images]
[im 2/31  brain]
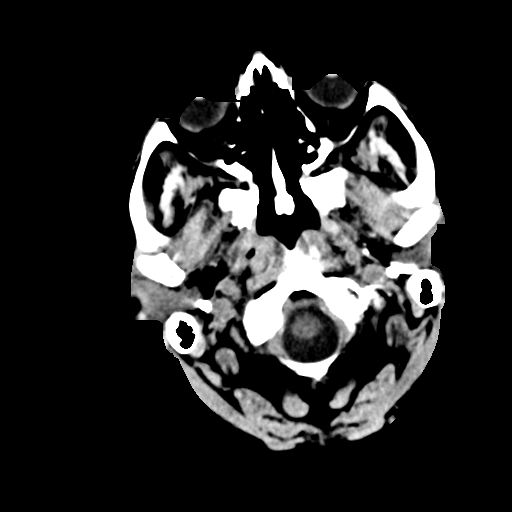
[im 2/31  bone]
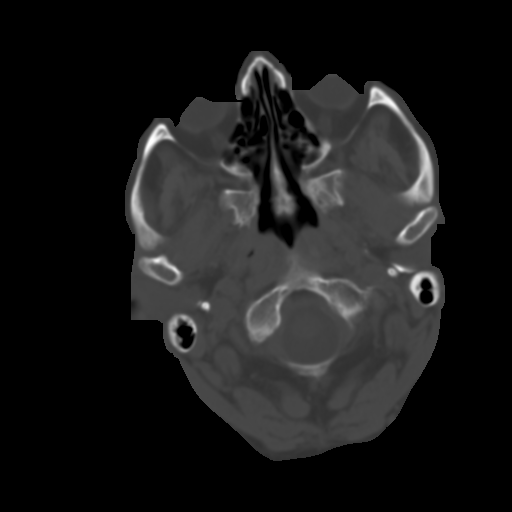
[im 4/31  brain]
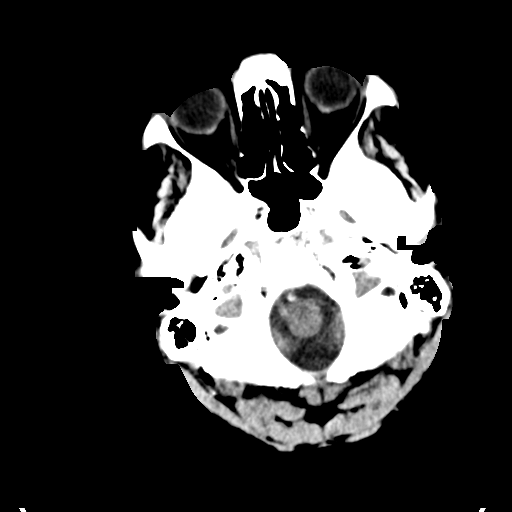
[im 6/31  brain]
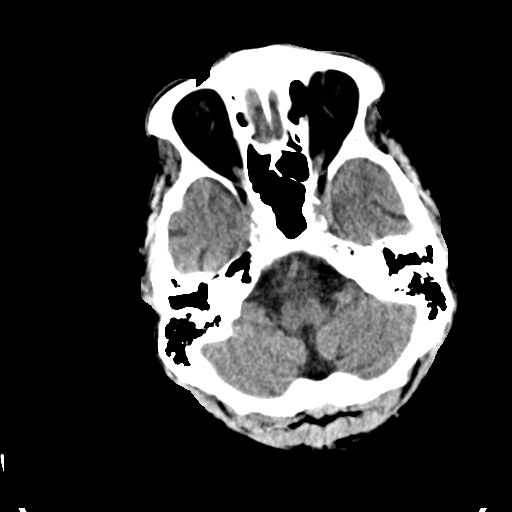
[im 8/31  brain]
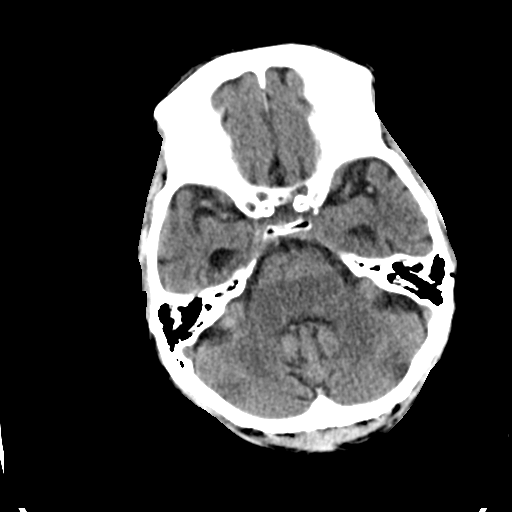
[im 9/31  brain]
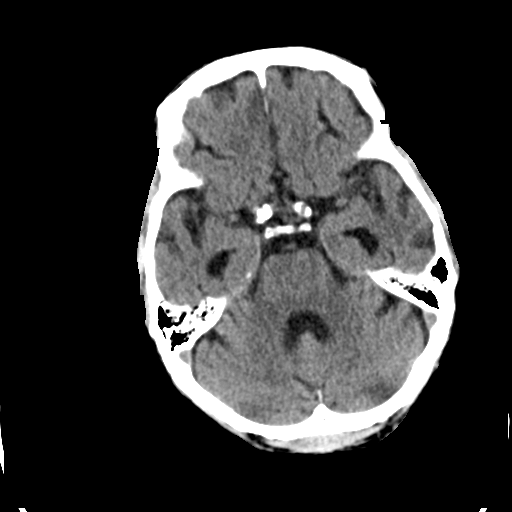
[im 9/31  bone]
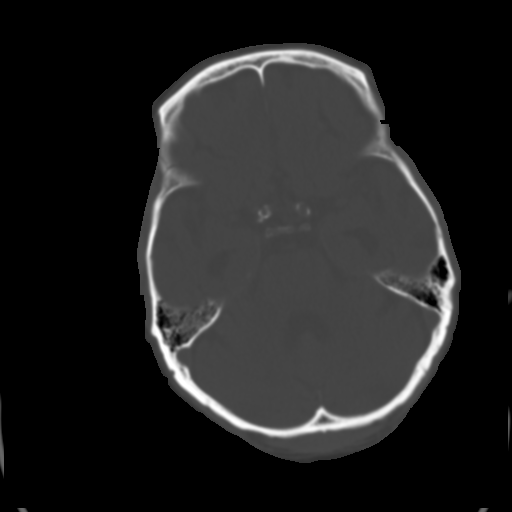
[im 11/31  brain]
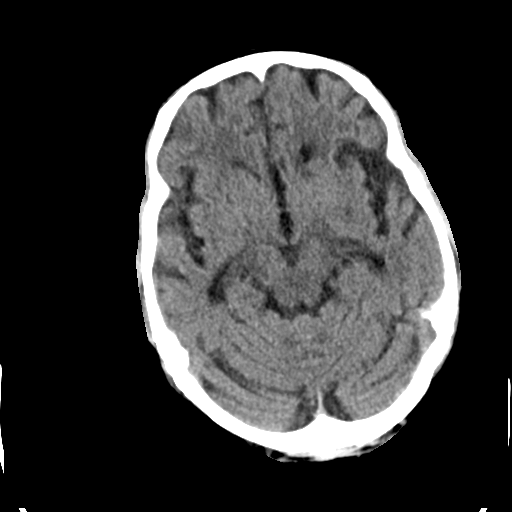
[im 13/31  brain]
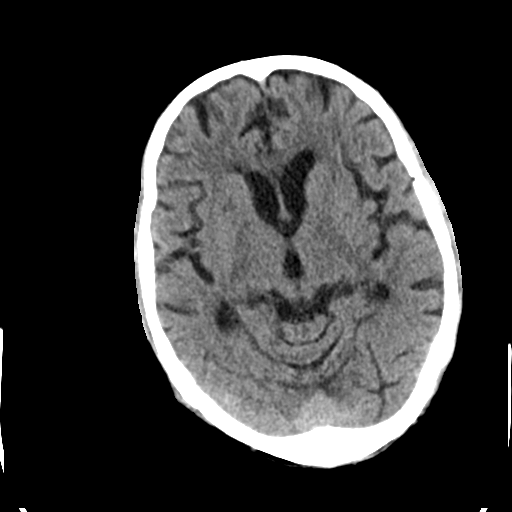
[im 15/31  brain]
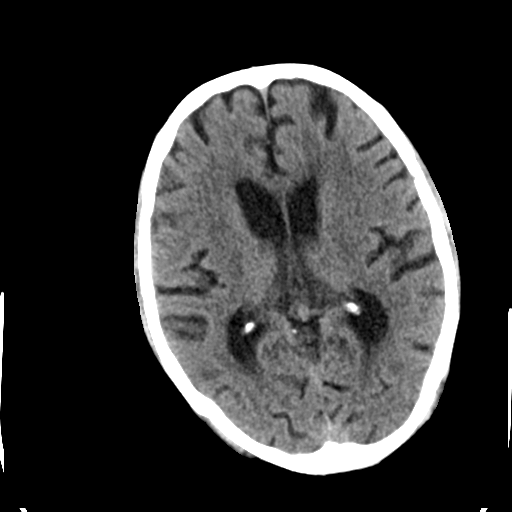
[im 16/31  brain]
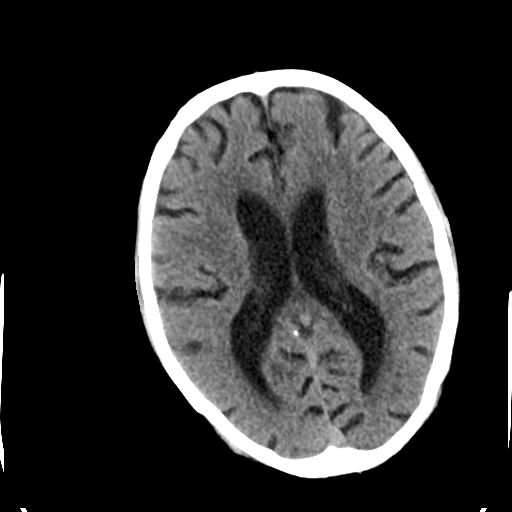
[im 16/31  bone]
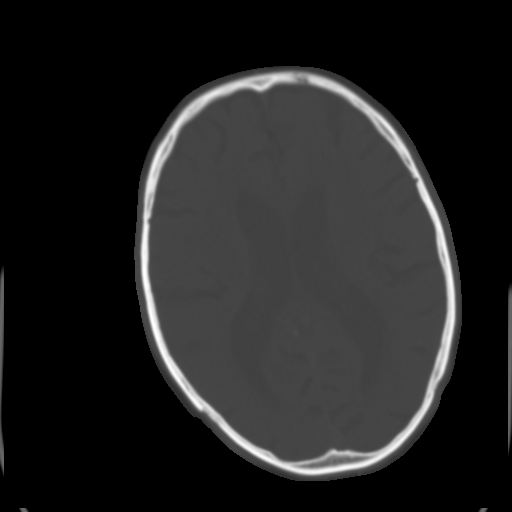
[im 18/31  brain]
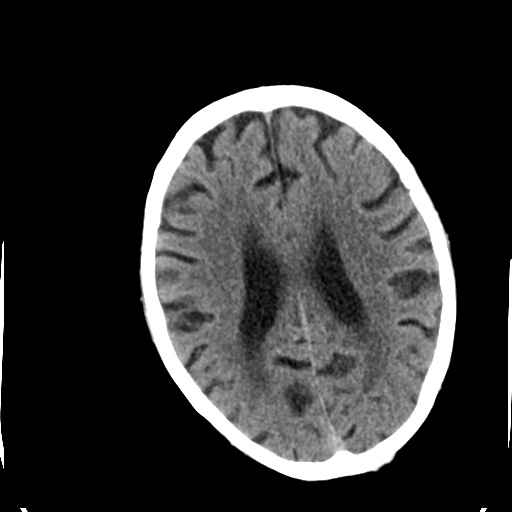
[im 20/31  brain]
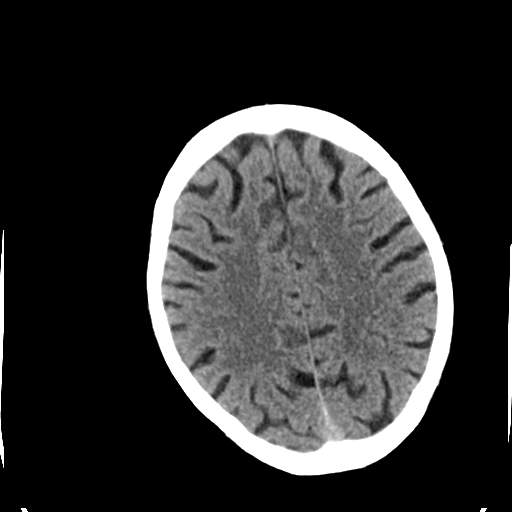
[im 22/31  brain]
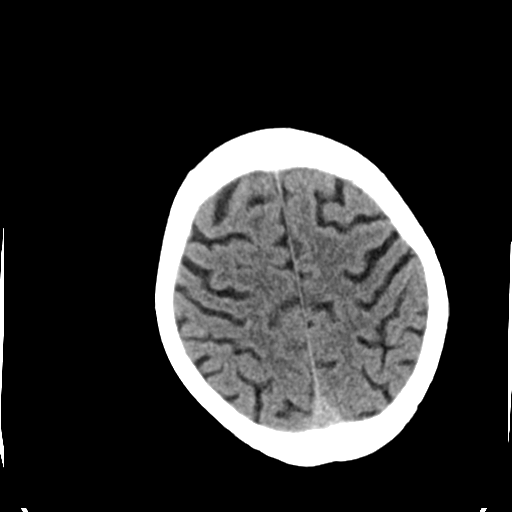
[im 23/31  brain]
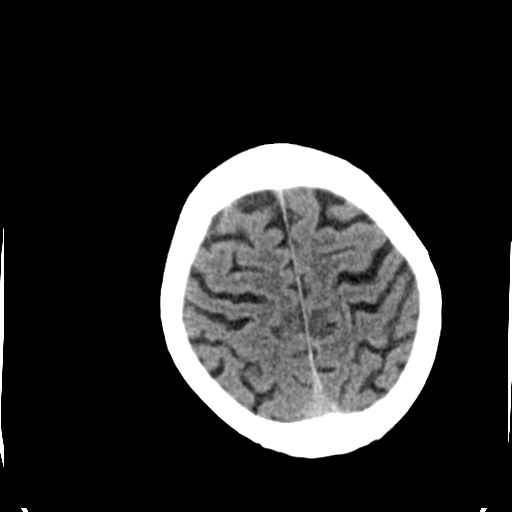
[im 23/31  bone]
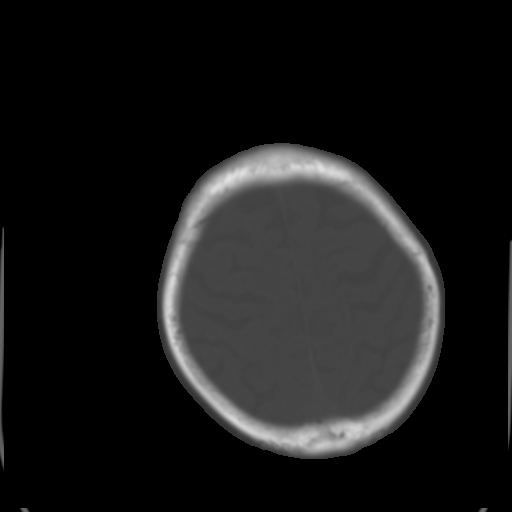
[im 25/31  brain]
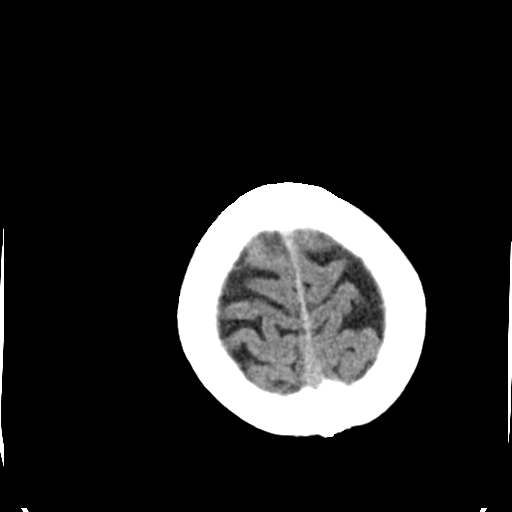
[im 27/31  brain]
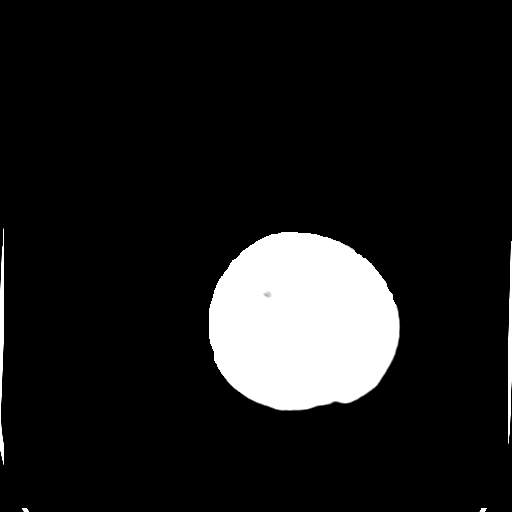
[im 29/31  brain]
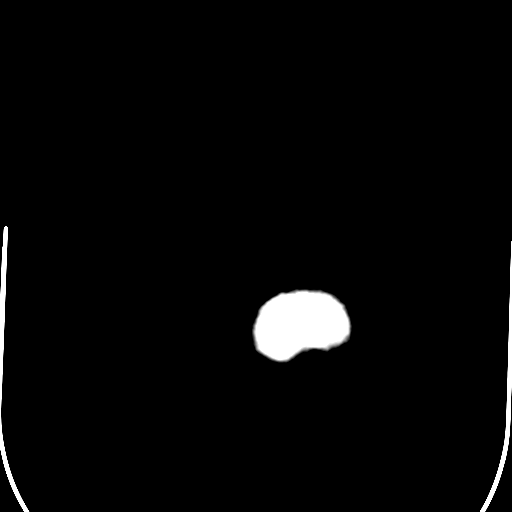

[16 of 30 positions shown; findings below may reference images not displayed]

FINDINGS: Calvarium: No acute osseous abnormality.  No lytic or blastic
lesion.

Orbits: No acute abnormality.

Brain: No evidence of acute abnormality, such as acute infarction,
hemorrhage, hydrocephalus, or mass lesion/mass effect.Senescent
changes including cerebral volume loss and patchy bilateral
cerebral white matter low attenuation, consistent with chronic
small vessel ischemia.  Extensive intracranial calcific
atherosclerosis.
IMPRESSION: 1. No CT evidence of acute intracranial abnormality.
2.  Senescent brain changes, stable from 09/09/2012.

## 2014-01-03 ENCOUNTER — Other Ambulatory Visit: Payer: Self-pay | Admitting: *Deleted

## 2014-01-12 ENCOUNTER — Encounter: Payer: Self-pay | Admitting: Vascular Surgery

## 2014-01-13 ENCOUNTER — Ambulatory Visit: Payer: Medicare Other | Admitting: Vascular Surgery

## 2014-01-13 ENCOUNTER — Encounter (HOSPITAL_COMMUNITY): Payer: Medicare Other

## 2014-01-13 ENCOUNTER — Other Ambulatory Visit (HOSPITAL_COMMUNITY): Payer: Medicare Other

## 2014-08-04 ENCOUNTER — Encounter (HOSPITAL_COMMUNITY): Payer: Self-pay | Admitting: Vascular Surgery

## 2014-09-21 ENCOUNTER — Encounter (HOSPITAL_COMMUNITY): Payer: Self-pay | Admitting: Emergency Medicine

## 2014-09-21 DIAGNOSIS — Z79899 Other long term (current) drug therapy: Secondary | ICD-10-CM | POA: Insufficient documentation

## 2014-09-21 DIAGNOSIS — I120 Hypertensive chronic kidney disease with stage 5 chronic kidney disease or end stage renal disease: Secondary | ICD-10-CM | POA: Insufficient documentation

## 2014-09-21 DIAGNOSIS — D696 Thrombocytopenia, unspecified: Secondary | ICD-10-CM | POA: Diagnosis not present

## 2014-09-21 DIAGNOSIS — D531 Other megaloblastic anemias, not elsewhere classified: Secondary | ICD-10-CM | POA: Insufficient documentation

## 2014-09-21 DIAGNOSIS — Z992 Dependence on renal dialysis: Secondary | ICD-10-CM | POA: Insufficient documentation

## 2014-09-21 DIAGNOSIS — Z043 Encounter for examination and observation following other accident: Secondary | ICD-10-CM | POA: Insufficient documentation

## 2014-09-21 DIAGNOSIS — I2510 Atherosclerotic heart disease of native coronary artery without angina pectoris: Secondary | ICD-10-CM | POA: Diagnosis not present

## 2014-09-21 DIAGNOSIS — Z8673 Personal history of transient ischemic attack (TIA), and cerebral infarction without residual deficits: Secondary | ICD-10-CM | POA: Diagnosis not present

## 2014-09-21 DIAGNOSIS — Z7902 Long term (current) use of antithrombotics/antiplatelets: Secondary | ICD-10-CM | POA: Diagnosis not present

## 2014-09-21 DIAGNOSIS — R410 Disorientation, unspecified: Secondary | ICD-10-CM | POA: Diagnosis not present

## 2014-09-21 DIAGNOSIS — R630 Anorexia: Secondary | ICD-10-CM | POA: Insufficient documentation

## 2014-09-21 DIAGNOSIS — K219 Gastro-esophageal reflux disease without esophagitis: Secondary | ICD-10-CM | POA: Insufficient documentation

## 2014-09-21 DIAGNOSIS — Z951 Presence of aortocoronary bypass graft: Secondary | ICD-10-CM | POA: Diagnosis not present

## 2014-09-21 DIAGNOSIS — Z8739 Personal history of other diseases of the musculoskeletal system and connective tissue: Secondary | ICD-10-CM | POA: Insufficient documentation

## 2014-09-21 DIAGNOSIS — N186 End stage renal disease: Secondary | ICD-10-CM | POA: Insufficient documentation

## 2014-09-21 DIAGNOSIS — W1830XA Fall on same level, unspecified, initial encounter: Secondary | ICD-10-CM | POA: Insufficient documentation

## 2014-09-21 DIAGNOSIS — Z8701 Personal history of pneumonia (recurrent): Secondary | ICD-10-CM | POA: Diagnosis not present

## 2014-09-21 DIAGNOSIS — E119 Type 2 diabetes mellitus without complications: Secondary | ICD-10-CM | POA: Diagnosis not present

## 2014-09-21 DIAGNOSIS — I252 Old myocardial infarction: Secondary | ICD-10-CM | POA: Diagnosis not present

## 2014-09-21 DIAGNOSIS — R531 Weakness: Secondary | ICD-10-CM | POA: Diagnosis present

## 2014-09-21 DIAGNOSIS — F329 Major depressive disorder, single episode, unspecified: Secondary | ICD-10-CM | POA: Diagnosis not present

## 2014-09-21 DIAGNOSIS — R61 Generalized hyperhidrosis: Secondary | ICD-10-CM | POA: Insufficient documentation

## 2014-09-21 DIAGNOSIS — R6883 Chills (without fever): Secondary | ICD-10-CM | POA: Insufficient documentation

## 2014-09-21 NOTE — ED Notes (Signed)
Pt presents with weakness for the past week- pt family reports pt has no energy and is unable to walk by himself.  Pt had graft placed to right arm yesterday- wife reports they were told that his EKG was abnormal and he needed to follow up with cardiology.  Pt doctor is out of town.  Pt denies pain.  Admits to loss of appetite recently.

## 2014-09-22 ENCOUNTER — Emergency Department (HOSPITAL_COMMUNITY)
Admission: EM | Admit: 2014-09-22 | Discharge: 2014-09-22 | Disposition: A | Discharge status: Home / Self Care | Payer: Medicare Other | Attending: Emergency Medicine | Admitting: Emergency Medicine

## 2014-09-22 ENCOUNTER — Emergency Department (HOSPITAL_COMMUNITY): Payer: Medicare Other

## 2014-09-22 DIAGNOSIS — D539 Nutritional anemia, unspecified: Secondary | ICD-10-CM

## 2014-09-22 DIAGNOSIS — Z992 Dependence on renal dialysis: Secondary | ICD-10-CM

## 2014-09-22 DIAGNOSIS — R531 Weakness: Secondary | ICD-10-CM

## 2014-09-22 DIAGNOSIS — W19XXXA Unspecified fall, initial encounter: Secondary | ICD-10-CM

## 2014-09-22 DIAGNOSIS — N186 End stage renal disease: Secondary | ICD-10-CM

## 2014-09-22 DIAGNOSIS — D696 Thrombocytopenia, unspecified: Secondary | ICD-10-CM | POA: Diagnosis not present

## 2014-09-22 DIAGNOSIS — Y92009 Unspecified place in unspecified non-institutional (private) residence as the place of occurrence of the external cause: Secondary | ICD-10-CM

## 2014-09-22 LAB — I-STAT TROPONIN, ED: TROPONIN I, POC: 0.01 ng/mL (ref 0.00–0.08)

## 2014-09-22 LAB — COMPREHENSIVE METABOLIC PANEL
ALK PHOS: 74 U/L (ref 39–117)
ALT: 11 U/L (ref 0–53)
AST: 15 U/L (ref 0–37)
Albumin: 3.1 g/dL — ABNORMAL LOW (ref 3.5–5.2)
Anion gap: 9 (ref 5–15)
BILIRUBIN TOTAL: 0.6 mg/dL (ref 0.3–1.2)
BUN: 24 mg/dL — AB (ref 6–23)
CO2: 28 mmol/L (ref 19–32)
Calcium: 9.2 mg/dL (ref 8.4–10.5)
Chloride: 99 mmol/L (ref 96–112)
Creatinine, Ser: 6.36 mg/dL — ABNORMAL HIGH (ref 0.50–1.35)
GFR calc Af Amer: 8 mL/min — ABNORMAL LOW (ref >90)
GFR calc non Af Amer: 7 mL/min — ABNORMAL LOW (ref >90)
GLUCOSE: 137 mg/dL — AB (ref 70–99)
POTASSIUM: 3.9 mmol/L (ref 3.5–5.1)
Sodium: 136 mmol/L (ref 135–145)
Total Protein: 6.6 g/dL (ref 6.0–8.3)

## 2014-09-22 LAB — CBC WITH DIFFERENTIAL/PLATELET
Basophils Absolute: 0 10*3/uL (ref 0.0–0.1)
Basophils Relative: 0 % (ref 0–1)
EOS PCT: 1 % (ref 0–5)
Eosinophils Absolute: 0.1 10*3/uL (ref 0.0–0.7)
HCT: 35.3 % — ABNORMAL LOW (ref 39.0–52.0)
HEMOGLOBIN: 11 g/dL — AB (ref 13.0–17.0)
Lymphocytes Relative: 18 % (ref 12–46)
Lymphs Abs: 1.1 10*3/uL (ref 0.7–4.0)
MCH: 32.3 pg (ref 26.0–34.0)
MCHC: 31.2 g/dL (ref 30.0–36.0)
MCV: 103.5 fL — ABNORMAL HIGH (ref 78.0–100.0)
MONOS PCT: 10 % (ref 3–12)
Monocytes Absolute: 0.6 10*3/uL (ref 0.1–1.0)
NEUTROS PCT: 71 % (ref 43–77)
Neutro Abs: 4.3 10*3/uL (ref 1.7–7.7)
PLATELETS: 83 10*3/uL — AB (ref 150–400)
RBC: 3.41 MIL/uL — ABNORMAL LOW (ref 4.22–5.81)
RDW: 15 % (ref 11.5–15.5)
WBC: 6 10*3/uL (ref 4.0–10.5)

## 2014-09-22 LAB — CK: Total CK: 37 U/L (ref 7–232)

## 2014-09-22 NOTE — Discharge Instructions (Signed)
Fall Prevention and Home Safety °Falls cause injuries and can affect all age groups. It is possible to use preventive measures to significantly decrease the likelihood of falls. There are many simple measures which can make your home safer and prevent falls. °OUTDOORS °· Repair cracks and edges of walkways and driveways. °· Remove high doorway thresholds. °· Trim shrubbery on the main path into your home. °· Have good outside lighting. °· Clear walkways of tools, rocks, debris, and clutter. °· Check that handrails are not broken and are securely fastened. Both sides of steps should have handrails. °· Have leaves, snow, and ice cleared regularly. °· Use sand or salt on walkways during winter months. °· In the garage, clean up grease or oil spills. °BATHROOM °· Install night lights. °· Install grab bars by the toilet and in the tub and shower. °· Use non-skid mats or decals in the tub or shower. °· Place a plastic non-slip stool in the shower to sit on, if needed. °· Keep floors dry and clean up all water on the floor immediately. °· Remove soap buildup in the tub or shower on a regular basis. °· Secure bath mats with non-slip, double-sided rug tape. °· Remove throw rugs and tripping hazards from the floors. °BEDROOMS °· Install night lights. °· Make sure a bedside light is easy to reach. °· Do not use oversized bedding. °· Keep a telephone by your bedside. °· Have a firm chair with side arms to use for getting dressed. °· Remove throw rugs and tripping hazards from the floor. °KITCHEN °· Keep handles on pots and pans turned toward the center of the stove. Use back burners when possible. °· Clean up spills quickly and allow time for drying. °· Avoid walking on wet floors. °· Avoid hot utensils and knives. °· Position shelves so they are not too high or low. °· Place commonly used objects within easy reach. °· If necessary, use a sturdy step stool with a grab bar when reaching. °· Keep electrical cables out of the  way. °· Do not use floor polish or wax that makes floors slippery. If you must use wax, use non-skid floor wax. °· Remove throw rugs and tripping hazards from the floor. °STAIRWAYS °· Never leave objects on stairs. °· Place handrails on both sides of stairways and use them. Fix any loose handrails. Make sure handrails on both sides of the stairways are as long as the stairs. °· Check carpeting to make sure it is firmly attached along stairs. Make repairs to worn or loose carpet promptly. °· Avoid placing throw rugs at the top or bottom of stairways, or properly secure the rug with carpet tape to prevent slippage. Get rid of throw rugs, if possible. °· Have an electrician put in a light switch at the top and bottom of the stairs. °OTHER FALL PREVENTION TIPS °· Wear low-heel or rubber-soled shoes that are supportive and fit well. Wear closed toe shoes. °· When using a stepladder, make sure it is fully opened and both spreaders are firmly locked. Do not climb a closed stepladder. °· Add color or contrast paint or tape to grab bars and handrails in your home. Place contrasting color strips on first and last steps. °· Learn and use mobility aids as needed. Install an electrical emergency response system. °· Turn on lights to avoid dark areas. Replace light bulbs that burn out immediately. Get light switches that glow. °· Arrange furniture to create clear pathways. Keep furniture in the same place. °·   Firmly attach carpet with non-skid or double-sided tape. °· Eliminate uneven floor surfaces. °· Select a carpet pattern that does not visually hide the edge of steps. °· Be aware of all pets. °OTHER HOME SAFETY TIPS °· Set the water temperature for 120° F (48.8° C). °· Keep emergency numbers on or near the telephone. °· Keep smoke detectors on every level of the home and near sleeping areas. °Document Released: 08/02/2002 Document Revised: 02/11/2012 Document Reviewed: 11/01/2011 °ExitCare® Patient Information ©2015  [employer]. This information is not intended to replace advice given to you by your health care provider. Make sure you discuss any questions you have with your health care provider. ° ° °Weakness °Weakness is a lack of strength. It may be felt all over the body (generalized) or in one specific part of the body (focal). Some causes of weakness can be serious. You may need further medical evaluation, especially if you are elderly or you have a history of immunosuppression (such as chemotherapy or HIV), kidney disease, heart disease, or diabetes. °CAUSES  °Weakness can be caused by many different things, including: °· Infection. °· Physical exhaustion. °· Internal bleeding or other blood loss that results in a lack of red blood cells (anemia). °· Dehydration. This cause is more common in elderly people. °· Side effects or electrolyte abnormalities from medicines, such as pain medicines or sedatives. °· Emotional distress, anxiety, or depression. °· Circulation problems, especially severe peripheral arterial disease. °· Heart disease, such as rapid atrial fibrillation, bradycardia, or heart failure. °· Nervous system disorders, such as Guillain-Barré syndrome, multiple sclerosis, or stroke. °DIAGNOSIS  °To find the cause of your weakness, your caregiver will take your history and perform a physical exam. Lab tests or X-rays may also be ordered, if needed. °TREATMENT  °Treatment of weakness depends on the cause of your symptoms and can vary greatly. °HOME CARE INSTRUCTIONS  °· Rest as needed. °· Eat a well-balanced diet. °· Try to get some exercise every day. °· Only take over-the-counter or prescription medicines as directed by your caregiver. °SEEK MEDICAL CARE IF:  °· Your weakness seems to be getting worse or spreads to other parts of your body. °· You develop new aches or pains. °SEEK IMMEDIATE MEDICAL CARE IF:  °· You cannot perform your normal daily activities, such as getting dressed and feeding  yourself. °· You cannot walk up and down stairs, or you feel exhausted when you do so. °· You have shortness of breath or chest pain. °· You have difficulty moving parts of your body. °· You have weakness in only one area of the body or on only one side of the body. °· You have a fever. °· You have trouble speaking or swallowing. °· You cannot control your bladder or bowel movements. °· You have black or bloody vomit or stools. °MAKE SURE YOU: °· Understand these instructions. °· Will watch your condition. °· Will get help right away if you are not doing well or get worse. °Document Released: 08/12/2005 Document Revised: 02/11/2012 Document Reviewed: 10/11/2011 °ExitCare® Patient Information ©2015 [employer]. This information is not intended to replace advice given to you by your health care provider. Make sure you discuss any questions you have with your health care provider. ° °

## 2014-09-22 NOTE — ED Notes (Signed)
Patient transported to CT 

## 2014-09-22 NOTE — ED Provider Notes (Signed)
CSN: [id number]     Arrival date & time 09/21/14  2302 History  This chart was scribed for Dione Boozeavid Arden Axon, MD by Elveria Risingimelie Horne, ED scribe.  This patient was seen in room B16C/B16C and the patient's care was started at 1:01 AM.   Chief Complaint  Patient presents with  . Weakness   The history is provided by the patient, a relative and the spouse. No language interpreter was used.   HPI Comments:  Shane [name] is a 81 y.o. male with extensive PMHx including CAD, MI, CVA, memory loss, Hypertension, and renal disease brought in by family to the Emergency Department complaining of weakness. Family, wife and son, reports that the patient has been weak with decreased appetite for 9 days now. Wife reports that his symptoms are worse today. Family states that the patient is needing assistance to do things he is normally able to do unassisted. Family reports cold sweats, but denies known fever. Patient uncertain of the last time he urinated stating "it was probably today." Patient receives dialysis MWF. Patient had surgery, graft to his right arm, yesterday and was discharged with pain medication. Family reports that his weakness presented before his surgery. Patient denies pain or medical complaints.  Wife reports that the patient was supposed to follow up with cardiology for abnormal EKG, but the doctor is out of town.   Past Medical History  Diagnosis Date  . Coronary artery disease   . Myocardial infarction     according to patient  . CVA (cerebral vascular accident)     2 mini strokes  . Arthritis   . GERD (gastroesophageal reflux disease)   . H/O hiatal hernia   . Memory loss   . Hypertension     not on medication  . Shortness of breath     with exertion  . Diabetes mellitus     not on medications  . Depression   . Pneumonia     stayed at [facility]  . Chronic kidney disease     on kidney transplant list  . ESRD (end stage renal disease)     Dialysis T/T/S   Past Surgical History   Procedure Laterality Date  . Dialysis fistula creation    . Coronary artery bypass graft  2002  . Av fistula placement  01/16/2012    Left BVT   . Venoplasty  04/06/2012    Left Basilic Vein Stenosis  . Av fistula placement Left 10/02/2012    Procedure: INSERTION OF ARTERIOVENOUS (AV) GORE-TEX GRAFT ARM;  Surgeon: Chuck Hinthristopher S Dickson, MD;  [facility];  Service: Vascular;  Laterality: Left;  . Thrombectomy and revision of arterioventous (av) goretex  graft Left 12/16/2012    Procedure: THROMBECTOMY AND REVISION OF ARTERIOVENTOUS (AV) GORETEX  GRAFT;  Surgeon: Fransisco HertzBrian L Chen, MD;  Location: [facility] OR;  Service: Vascular;  Laterality: Left;  Ultrasound guided  . Arteriovenous graft placement    . [facility]  . Av fistula placement Left 02/01/2013    Procedure: INSERTION OF ARTERIOVENOUS (AV) GORE-TEX GRAFT ARM;  Surgeon: Sherren Kernsharles E Fields, MD;  [facility];  Service: Vascular;  Laterality: Left;  . Fistulogram Left 04/06/2012    Procedure: FISTULOGRAM;  Surgeon: Chuck Hinthristopher S Dickson, MD;  [facility];  Service: Cardiovascular;  Laterality: Left;  . Shuntogram N/A 06/01/2012    Procedure: Betsey AmenSHUNTOGRAM;  Surgeon: Chuck Hinthristopher S Dickson, MD;  [facility];  Service: Cardiovascular;  Laterality: N/A;   Family History  Problem Relation Age of Onset  . Diabetes    . Kidney disease    . Heart disease    . Stroke     History  Substance Use Topics  . Smoking status: Never Smoker   . Smokeless tobacco: Former Neurosurgeon    Types: Chew    Quit date: 08/26/2009  . Alcohol Use: No    Review of Systems  Constitutional: Positive for chills, activity change and appetite change. Negative for fever.  Gastrointestinal: Negative for nausea, vomiting and diarrhea.  Genitourinary: Negative for hematuria and decreased urine volume.  Psychiatric/Behavioral: Positive for confusion.   Allergies  Review of patient's allergies  indicates no known allergies.  Home Medications   Prior to Admission medications   Medication Sig Start Date End Date Taking? Authorizing Provider  allopurinol (ZYLOPRIM) 100 MG tablet Take 100 mg by mouth daily.  02/20/11   Historical Provider, MD  atorvastatin (LIPITOR) 40 MG tablet Take 40 mg by mouth daily. 02/20/11   Iran Ouch, MD  benzonatate (TESSALON) 200 MG capsule Take 200 mg by mouth 3 (three) times daily as needed for cough.    Historical Provider, MD  calcitRIOL (ROCALTROL) 0.5 MCG capsule Take 0.5 mcg by mouth daily.  09/03/12   Historical Provider, MD  citalopram (CELEXA) 40 MG tablet Take by mouth daily.  12/07/12   Historical Provider, MD  clopidogrel (PLAVIX) 75 MG tablet Take 75 mg by mouth daily.    Historical Provider, MD  divalproex (DEPAKOTE ER) 500 MG 24 hr tablet Take 1 tablet (500 mg total) by mouth daily. 11/16/12   Delia Heady, MD  finasteride (PROSCAR) 5 MG tablet Take 5 mg by mouth daily.  12/07/12   Historical Provider, MD  midodrine (PROAMATINE) 5 MG tablet Take by mouth 2 (two) times daily.  12/17/12   Historical Provider, MD  multivitamin (RENA-VIT) TABS tablet Take 1 tablet by mouth daily. 09/09/12   Laveda Norman, MD  NAMENDA 10 MG tablet Take 10 mg by mouth 2 (two) times daily.  02/20/11   Historical Provider, MD  oxyCODONE (ROXICODONE) 5 MG immediate release tablet Take 1-2 tablets (5-10 mg total) by mouth every 4 (four) hours as needed for pain. 02/01/13   Regina J Roczniak, PA-C  ranitidine (ZANTAC) 150 MG tablet Take 150 mg by mouth 2 (two) times daily.    Historical Provider, MD   Triage Vitals: BP 105/50 mmHg  Pulse 95  Temp(Src) 99 F (37.2 C) (Oral)  Resp 16  Ht 5\' 7"  (1.702 m)  Wt 169 lb (76.658 kg)  BMI 26.46 kg/m2  SpO2 99% Physical Exam  Constitutional: He is oriented to person, place, and time. He appears well-developed and well-nourished. No distress.  HENT:  Head: Normocephalic and atraumatic.  Eyes: EOM are normal. Pupils are equal,  round, and reactive to light.  Neck: No JVD present.  Cardiovascular: Normal rate, regular rhythm and normal heart sounds.   Pulmonary/Chest: Effort normal and breath sounds normal. He has no wheezes. He has no rales.  Abdominal: Soft. Bowel sounds are normal. He exhibits no distension and no mass. There is no tenderness.  Musculoskeletal: Normal range of motion. He exhibits no edema.  AV fistula present in right upper arm with thrill present.   Lymphadenopathy:    He has no cervical adenopathy.  Neurological: He is alert and oriented to person, place, and time. He has normal reflexes. No cranial nerve deficit.  oriented to person  and place but not time.   Skin: Skin is warm and dry. No rash noted.  Psychiatric: He has a normal mood and affect. His behavior is normal.  Nursing note and vitals reviewed.   ED Course  Procedures (including critical care time)  COORDINATION OF CARE: 1:15 AM- Discussed treatment plan with patient's family at bedside and they agreed to plan.   Labs Review Results for orders placed or performed during the hospital encounter of 09/22/14  CBC with Differential  Result Value Ref Range   WBC 6.0 4.0 - 10.5 K/uL   RBC 3.41 (L) 4.22 - 5.81 MIL/uL   Hemoglobin 11.0 (L) 13.0 - 17.0 g/dL   HCT 40.9 (L) 81.1 - 91.4 %   MCV 103.5 (H) 78.0 - 100.0 fL   MCH 32.3 26.0 - 34.0 pg   MCHC 31.2 30.0 - 36.0 g/dL   RDW 78.2 95.6 - 21.3 %   Platelets 83 (L) 150 - 400 K/uL   Neutrophils Relative % 71 43 - 77 %   Neutro Abs 4.3 1.7 - 7.7 K/uL   Lymphocytes Relative 18 12 - 46 %   Lymphs Abs 1.1 0.7 - 4.0 K/uL   Monocytes Relative 10 3 - 12 %   Monocytes Absolute 0.6 0.1 - 1.0 K/uL   Eosinophils Relative 1 0 - 5 %   Eosinophils Absolute 0.1 0.0 - 0.7 K/uL   Basophils Relative 0 0 - 1 %   Basophils Absolute 0.0 0.0 - 0.1 K/uL  Comprehensive metabolic panel  Result Value Ref Range   Sodium 136 135 - 145 mmol/L   Potassium 3.9 3.5 - 5.1 mmol/L   Chloride 99 96 - 112  mmol/L   CO2 28 19 - 32 mmol/L   Glucose, Bld 137 (H) 70 - 99 mg/dL   BUN 24 (H) 6 - 23 mg/dL   Creatinine, Ser 0.86 (H) 0.50 - 1.35 mg/dL   Calcium 9.2 8.4 - 57.8 mg/dL   Total Protein 6.6 6.0 - 8.3 g/dL   Albumin 3.1 (L) 3.5 - 5.2 g/dL   AST 15 0 - 37 U/L   ALT 11 0 - 53 U/L   Alkaline Phosphatase 74 39 - 117 U/L   Total Bilirubin 0.6 0.3 - 1.2 mg/dL   GFR calc non Af Amer 7 (L) >90 mL/min   GFR calc Af Amer 8 (L) >90 mL/min   Anion gap 9 5 - 15  CK  Result Value Ref Range   Total CK 37 7 - 232 U/L  I-Stat Troponin, ED (not at [facility])  Result Value Ref Range   Troponin i, poc 0.01 0.00 - 0.08 ng/mL   Comment 3            Imaging Review Ct Head Wo Contrast  09/22/2014   CLINICAL DATA:  Weakness for the past week. No energy and unable to walk by himself. Graft placed in the right arm yesterday. Abnormal EKG reported.  EXAM: CT HEAD WITHOUT CONTRAST  TECHNIQUE: Contiguous axial images were obtained from the base of the skull through the vertex without intravenous contrast.  COMPARISON:  03/07/2013  FINDINGS: Diffuse cerebral atrophy. Ventricular dilatation consistent with central atrophy. Low-attenuation changes in the deep white matter consistent with small vessel ischemia. No mass effect or midline shift. No abnormal extra-axial fluid collections. Gray-white matter junctions are distinct. Basal cisterns are not effaced. No evidence of acute intracranial hemorrhage. No depressed skull fractures. Visualized paranasal sinuses and mastoid air cells are not opacified. Vascular  calcifications.  IMPRESSION: No acute intracranial abnormalities. Chronic atrophy and small vessel ischemic changes.   Electronically Signed   By: Burman Nieves M.D.   On: 09/22/2014 01:43   Dg Chest Port 1 View  09/22/2014   CLINICAL DATA:  Weakness. History of coronary artery disease, diabetes, pneumonia.  EXAM: PORTABLE CHEST - 1 VIEW  COMPARISON:  09/03/2013  FINDINGS: Postoperative changes in the mediastinum.  Interval placement of a left central venous dialysis catheter with tip over the low SVC region. Interval placement of vascular grafts or stents in the axillary regions bilaterally and in the right superior mediastinum. Heart size and pulmonary vascularity are normal for technique. Increased density over the left lung base is probably due to soft tissue attenuation. No focal airspace disease or consolidation. No blunting of costophrenic angles. No pneumothorax. Mediastinal contours appear intact.  IMPRESSION: Interval postoperative changes and insertion of left central venous catheter. No pneumothorax. No evidence of active pulmonary disease.   Electronically Signed   By: Burman Nieves M.D.   On: 09/22/2014 01:41    Date: 09/22/2014  Rate: 95  Rhythm: normal sinus rhythm  QRS Axis: normal  Intervals: normal  ST/T Wave abnormalities: normal  Conduction Disutrbances:right bundle branch block  Narrative Interpretation: Right bundle branch block, old inferior wall myocardial infarction. When compared with ECG of 03/07/2013, no significant changes are seen.  Old EKG Reviewed: unchanged    MDM   Final diagnoses:  Weakness  Fall at home, initial encounter  ESRD on hemodialysis  Macrocytic anemia  Thrombocytopenia    Weakness with fall at home. Workup was done in the ED without any obvious cause for weakness identified. He was not able to provide a urine sample, so occult UTI as a possibility. Orthostatic vital signs were not remarkable. He is discharged with instructions to follow-up with PCP and continue with routine dialysis.  I personally performed the services described in this documentation, which was scribed in my presence. The recorded information has been reviewed and is accurate.    Dione Booze, MD 09/22/14 [phone]

## 2015-02-09 ENCOUNTER — Other Ambulatory Visit: Payer: Self-pay | Admitting: Interventional Radiology

## 2015-02-09 ENCOUNTER — Emergency Department (HOSPITAL_COMMUNITY): Payer: Medicare Other

## 2015-02-09 ENCOUNTER — Encounter (HOSPITAL_COMMUNITY): Payer: Self-pay | Admitting: Emergency Medicine

## 2015-02-09 ENCOUNTER — Inpatient Hospital Stay (HOSPITAL_COMMUNITY): Payer: Medicare Other

## 2015-02-09 ENCOUNTER — Inpatient Hospital Stay (HOSPITAL_COMMUNITY)
Admission: EM | Admit: 2015-02-09 | Discharge: 2015-02-24 | DRG: 299 | Disposition: E | Payer: Medicare Other | Attending: Family Medicine | Admitting: Family Medicine

## 2015-02-09 DIAGNOSIS — F0390 Unspecified dementia without behavioral disturbance: Secondary | ICD-10-CM | POA: Diagnosis present

## 2015-02-09 DIAGNOSIS — Z951 Presence of aortocoronary bypass graft: Secondary | ICD-10-CM

## 2015-02-09 DIAGNOSIS — Z79899 Other long term (current) drug therapy: Secondary | ICD-10-CM | POA: Diagnosis not present

## 2015-02-09 DIAGNOSIS — Z992 Dependence on renal dialysis: Secondary | ICD-10-CM | POA: Diagnosis not present

## 2015-02-09 DIAGNOSIS — M1990 Unspecified osteoarthritis, unspecified site: Secondary | ICD-10-CM | POA: Diagnosis present

## 2015-02-09 DIAGNOSIS — Z66 Do not resuscitate: Secondary | ICD-10-CM | POA: Diagnosis present

## 2015-02-09 DIAGNOSIS — I447 Left bundle-branch block, unspecified: Secondary | ICD-10-CM | POA: Diagnosis present

## 2015-02-09 DIAGNOSIS — G4700 Insomnia, unspecified: Secondary | ICD-10-CM | POA: Diagnosis present

## 2015-02-09 DIAGNOSIS — I82B11 Acute embolism and thrombosis of right subclavian vein: Secondary | ICD-10-CM | POA: Diagnosis present

## 2015-02-09 DIAGNOSIS — Z833 Family history of diabetes mellitus: Secondary | ICD-10-CM | POA: Diagnosis not present

## 2015-02-09 DIAGNOSIS — D638 Anemia in other chronic diseases classified elsewhere: Secondary | ICD-10-CM | POA: Diagnosis present

## 2015-02-09 DIAGNOSIS — Z791 Long term (current) use of non-steroidal anti-inflammatories (NSAID): Secondary | ICD-10-CM | POA: Diagnosis not present

## 2015-02-09 DIAGNOSIS — K219 Gastro-esophageal reflux disease without esophagitis: Secondary | ICD-10-CM | POA: Diagnosis present

## 2015-02-09 DIAGNOSIS — I871 Compression of vein: Secondary | ICD-10-CM | POA: Diagnosis not present

## 2015-02-09 DIAGNOSIS — E1122 Type 2 diabetes mellitus with diabetic chronic kidney disease: Secondary | ICD-10-CM | POA: Diagnosis present

## 2015-02-09 DIAGNOSIS — I2510 Atherosclerotic heart disease of native coronary artery without angina pectoris: Secondary | ICD-10-CM | POA: Diagnosis present

## 2015-02-09 DIAGNOSIS — F329 Major depressive disorder, single episode, unspecified: Secondary | ICD-10-CM | POA: Diagnosis present

## 2015-02-09 DIAGNOSIS — Z79891 Long term (current) use of opiate analgesic: Secondary | ICD-10-CM | POA: Diagnosis not present

## 2015-02-09 DIAGNOSIS — Z7902 Long term (current) use of antithrombotics/antiplatelets: Secondary | ICD-10-CM

## 2015-02-09 DIAGNOSIS — R0602 Shortness of breath: Secondary | ICD-10-CM | POA: Diagnosis not present

## 2015-02-09 DIAGNOSIS — I288 Other diseases of pulmonary vessels: Secondary | ICD-10-CM | POA: Diagnosis present

## 2015-02-09 DIAGNOSIS — I252 Old myocardial infarction: Secondary | ICD-10-CM | POA: Diagnosis not present

## 2015-02-09 DIAGNOSIS — N186 End stage renal disease: Secondary | ICD-10-CM | POA: Diagnosis present

## 2015-02-09 DIAGNOSIS — I120 Hypertensive chronic kidney disease with stage 5 chronic kidney disease or end stage renal disease: Secondary | ICD-10-CM | POA: Diagnosis present

## 2015-02-09 DIAGNOSIS — Z87891 Personal history of nicotine dependence: Secondary | ICD-10-CM | POA: Diagnosis not present

## 2015-02-09 DIAGNOSIS — I82210 Acute embolism and thrombosis of superior vena cava: Principal | ICD-10-CM | POA: Diagnosis present

## 2015-02-09 DIAGNOSIS — N2581 Secondary hyperparathyroidism of renal origin: Secondary | ICD-10-CM | POA: Diagnosis present

## 2015-02-09 DIAGNOSIS — D509 Iron deficiency anemia, unspecified: Secondary | ICD-10-CM | POA: Diagnosis not present

## 2015-02-09 DIAGNOSIS — Z8673 Personal history of transient ischemic attack (TIA), and cerebral infarction without residual deficits: Secondary | ICD-10-CM

## 2015-02-09 DIAGNOSIS — D649 Anemia, unspecified: Secondary | ICD-10-CM | POA: Diagnosis present

## 2015-02-09 HISTORY — DX: Anemia, unspecified: D64.9

## 2015-02-09 LAB — CBC WITH DIFFERENTIAL/PLATELET
BASOS ABS: 0 10*3/uL (ref 0.0–0.1)
Basophils Relative: 0 % (ref 0–1)
EOS PCT: 1 % (ref 0–5)
Eosinophils Absolute: 0.1 10*3/uL (ref 0.0–0.7)
HCT: 22.5 % — ABNORMAL LOW (ref 39.0–52.0)
Hemoglobin: 7.3 g/dL — ABNORMAL LOW (ref 13.0–17.0)
LYMPHS ABS: 0.7 10*3/uL (ref 0.7–4.0)
LYMPHS PCT: 12 % (ref 12–46)
MCH: 31.5 pg (ref 26.0–34.0)
MCHC: 32.4 g/dL (ref 30.0–36.0)
MCV: 97 fL (ref 78.0–100.0)
Monocytes Absolute: 0.5 10*3/uL (ref 0.1–1.0)
Monocytes Relative: 8 % (ref 3–12)
NEUTROS PCT: 79 % — AB (ref 43–77)
Neutro Abs: 4.7 10*3/uL (ref 1.7–7.7)
PLATELETS: 127 10*3/uL — AB (ref 150–400)
RBC: 2.32 MIL/uL — AB (ref 4.22–5.81)
RDW: 16.2 % — AB (ref 11.5–15.5)
WBC: 5.9 10*3/uL (ref 4.0–10.5)

## 2015-02-09 LAB — IRON AND TIBC
Iron: 26 ug/dL — ABNORMAL LOW (ref 45–182)
Saturation Ratios: 15 % — ABNORMAL LOW (ref 17.9–39.5)
TIBC: 178 ug/dL — ABNORMAL LOW (ref 250–450)
UIBC: 152 ug/dL

## 2015-02-09 LAB — RETICULOCYTES
RBC.: 2.56 MIL/uL — ABNORMAL LOW (ref 4.22–5.81)
Retic Count, Absolute: 58.9 10*3/uL (ref 19.0–186.0)
Retic Ct Pct: 2.3 % (ref 0.4–3.1)

## 2015-02-09 LAB — PROTIME-INR
INR: 1.3 (ref 0.00–1.49)
PROTHROMBIN TIME: 16.3 s — AB (ref 11.6–15.2)

## 2015-02-09 LAB — BASIC METABOLIC PANEL
ANION GAP: 10 (ref 5–15)
BUN: 30 mg/dL — ABNORMAL HIGH (ref 6–20)
CO2: 27 mmol/L (ref 22–32)
Calcium: 9.5 mg/dL (ref 8.9–10.3)
Chloride: 100 mmol/L — ABNORMAL LOW (ref 101–111)
Creatinine, Ser: 6.69 mg/dL — ABNORMAL HIGH (ref 0.61–1.24)
GFR calc Af Amer: 8 mL/min — ABNORMAL LOW (ref >60)
GFR, EST NON AFRICAN AMERICAN: 7 mL/min — AB (ref >60)
GLUCOSE: 131 mg/dL — AB (ref 65–99)
POTASSIUM: 3.7 mmol/L (ref 3.5–5.1)
SODIUM: 137 mmol/L (ref 135–145)

## 2015-02-09 LAB — CBC
HEMATOCRIT: 25.4 % — AB (ref 39.0–52.0)
HEMOGLOBIN: 8.2 g/dL — AB (ref 13.0–17.0)
MCH: 32 pg (ref 26.0–34.0)
MCHC: 32.3 g/dL (ref 30.0–36.0)
MCV: 99.2 fL (ref 78.0–100.0)
Platelets: 115 10*3/uL — ABNORMAL LOW (ref 150–400)
RBC: 2.56 MIL/uL — ABNORMAL LOW (ref 4.22–5.81)
RDW: 16.3 % — ABNORMAL HIGH (ref 11.5–15.5)
WBC: 5.6 10*3/uL (ref 4.0–10.5)

## 2015-02-09 LAB — FOLATE: FOLATE: 40 ng/mL (ref >5.9)

## 2015-02-09 LAB — APTT: aPTT: 45 seconds — ABNORMAL HIGH (ref 24–37)

## 2015-02-09 LAB — CREATININE, SERUM
Creatinine, Ser: 7 mg/dL — ABNORMAL HIGH (ref 0.61–1.24)
GFR calc Af Amer: 8 mL/min — ABNORMAL LOW (ref >60)
GFR calc non Af Amer: 7 mL/min — ABNORMAL LOW (ref >60)

## 2015-02-09 LAB — VITAMIN B12: VITAMIN B 12: 934 pg/mL — AB (ref 180–914)

## 2015-02-09 LAB — FERRITIN: Ferritin: 1287 ng/mL — ABNORMAL HIGH (ref 24–336)

## 2015-02-09 LAB — POC OCCULT BLOOD, ED: Fecal Occult Bld: NEGATIVE

## 2015-02-09 LAB — MRSA PCR SCREENING: MRSA by PCR: NEGATIVE

## 2015-02-09 MED ORDER — ACETAMINOPHEN 650 MG RE SUPP: 650.0000 mg | Freq: Four times a day (QID) | RECTAL | Status: DC | PRN

## 2015-02-09 MED ORDER — MEMANTINE HCL 10 MG PO TABS
10.0000 mg | ORAL_TABLET | Freq: Two times a day (BID) | ORAL | Status: DC
  Filled 2015-02-09: qty 1

## 2015-02-09 MED ORDER — CALCITRIOL 0.5 MCG PO CAPS
0.5000 ug | ORAL_CAPSULE | ORAL | Status: DC
  Filled 2015-02-09: qty 1

## 2015-02-09 MED ORDER — FAMOTIDINE 20 MG PO TABS
20.0000 mg | ORAL_TABLET | Freq: Two times a day (BID) | ORAL | Status: DC
  Filled 2015-02-09: qty 1

## 2015-02-09 MED ORDER — RENA-VITE PO TABS
1.0000 | ORAL_TABLET | Freq: Every day | ORAL | Status: DC
  Administered 2015-02-09: 1 via ORAL
  Filled 2015-02-09 (×2): qty 1

## 2015-02-09 MED ORDER — FINASTERIDE 5 MG PO TABS: 5.0000 mg | ORAL_TABLET | Freq: Every day | ORAL | Status: DC

## 2015-02-09 MED ORDER — NEPRO/CARBSTEADY PO LIQD: 237.0000 mL | Freq: Two times a day (BID) | ORAL | Status: DC

## 2015-02-09 MED ORDER — DIVALPROEX SODIUM ER 500 MG PO TB24
500.0000 mg | ORAL_TABLET | Freq: Every day | ORAL | Status: DC
  Filled 2015-02-09: qty 1

## 2015-02-09 MED ORDER — CLOPIDOGREL BISULFATE 75 MG PO TABS
75.0000 mg | ORAL_TABLET | Freq: Every day | ORAL | Status: DC
  Filled 2015-02-09: qty 1

## 2015-02-09 MED ORDER — HEPARIN (PORCINE) IN NACL 100-0.45 UNIT/ML-% IJ SOLN
1300.0000 [IU]/h | INTRAMUSCULAR | Status: DC
  Administered 2015-02-09: 1300 [IU]/h via INTRAVENOUS
  Filled 2015-02-09 (×2): qty 250

## 2015-02-09 MED ORDER — MIDODRINE HCL 5 MG PO TABS
5.0000 mg | ORAL_TABLET | Freq: Two times a day (BID) | ORAL | Status: DC
  Administered 2015-02-09: 5 mg via ORAL
  Filled 2015-02-09 (×2): qty 1

## 2015-02-09 MED ORDER — DARBEPOETIN ALFA 100 MCG/0.5ML IJ SOSY
100.0000 ug | PREFILLED_SYRINGE | INTRAMUSCULAR | Status: DC
  Filled 2015-02-09: qty 0.5

## 2015-02-09 MED ORDER — ACETAMINOPHEN 325 MG PO TABS: 650.0000 mg | ORAL_TABLET | Freq: Four times a day (QID) | ORAL | Status: DC | PRN

## 2015-02-09 MED ORDER — CITALOPRAM HYDROBROMIDE 40 MG PO TABS: 40.0000 mg | ORAL_TABLET | Freq: Every day | ORAL | Status: DC

## 2015-02-09 MED ORDER — ONDANSETRON HCL 4 MG/2ML IJ SOLN: 4.0000 mg | Freq: Four times a day (QID) | INTRAMUSCULAR | Status: DC | PRN

## 2015-02-09 MED ORDER — OXYCODONE HCL 5 MG PO TABS: 5.0000 mg | ORAL_TABLET | Freq: Four times a day (QID) | ORAL | Status: DC | PRN

## 2015-02-09 MED ORDER — HEPARIN SODIUM (PORCINE) 5000 UNIT/ML IJ SOLN: 5000.0000 [IU] | Freq: Three times a day (TID) | INTRAMUSCULAR | Status: DC

## 2015-02-09 MED ORDER — HEPARIN BOLUS VIA INFUSION
4000.0000 [IU] | Freq: Once | INTRAVENOUS | Status: AC
  Administered 2015-02-09: 4000 [IU] via INTRAVENOUS
  Filled 2015-02-09: qty 4000

## 2015-02-09 MED ORDER — IOHEXOL 300 MG/ML  SOLN
75.0000 mL | Freq: Once | INTRAMUSCULAR | Status: AC | PRN
  Administered 2015-02-09: 100 mL via INTRAVENOUS

## 2015-02-09 MED ORDER — ALLOPURINOL 100 MG PO TABS: 100.0000 mg | ORAL_TABLET | Freq: Every day | ORAL | Status: DC

## 2015-02-09 MED ORDER — ATORVASTATIN CALCIUM 40 MG PO TABS
40.0000 mg | ORAL_TABLET | Freq: Every day | ORAL | Status: DC
  Administered 2015-02-09: 40 mg via ORAL
  Filled 2015-02-09 (×2): qty 1

## 2015-02-09 MED ORDER — ONDANSETRON HCL 4 MG PO TABS: 4.0000 mg | ORAL_TABLET | Freq: Four times a day (QID) | ORAL | Status: DC | PRN

## 2015-02-09 NOTE — ED Notes (Signed)
Pt placed in gown and in bed. Pt monitored by pulse ox, bp cuff, and 12-lead. 

## 2015-02-09 NOTE — Consult Note (Signed)
Indication for Consultation:  Management of ESRD/hemodialysis; anemia, hypertension/volume and secondary hyperparathyroidism  HPI: Shane [name] is a 81 y.o. male who was sent to the ED from [facility] for evaluation of low 02 sats after receiving fentanyl during procedure. He receives HD MWF @ [facility], last HD yesterday, history of HTN, CAD, MI and CVA. He had been having a lot of access issues recently, he had 4 declots of R AVG in the past 10 days and eventually had L IJ HD cath placed 6/14 as AVG was nonfunctioning. He began to experience facial swelling since the catheter was placed, mild initially but was progressing. He went back to [facility] this AM and had L IJ removed and R fem cath placed. At the end of procedure he had low sats and was sent for evaluation, Will arrange HD while admitted.   Past Medical History  Diagnosis Date  . Coronary artery disease   . Myocardial infarction     according to patient  . CVA (cerebral vascular accident)     2 mini strokes  . Arthritis   . GERD (gastroesophageal reflux disease)   . H/O hiatal hernia   . Memory loss   . Hypertension     not on medication  . Shortness of breath     with exertion  . Diabetes mellitus     not on medications  . Depression   . Pneumonia     stayed at [facility]  . Chronic kidney disease     on kidney transplant list  . ESRD (end stage renal disease)     Dialysis T/T/S   Past Surgical History  Procedure Laterality Date  . Dialysis fistula creation    . Coronary artery bypass graft  2002  . Av fistula placement  01/16/2012    Left BVT   . Venoplasty  04/06/2012    Left Basilic Vein Stenosis  . Av fistula placement Left 10/02/2012    Procedure: INSERTION OF ARTERIOVENOUS (AV) GORE-TEX GRAFT ARM;  Surgeon: [name], MD;  Location: [facility] OR;  Service: Vascular;  Laterality: Left;  . Thrombectomy and revision of arterioventous (av) goretex  graft Left 12/16/2012    Procedure: THROMBECTOMY AND REVISION OF ARTERIOVENTOUS  (AV) GORETEX  GRAFT;  Surgeon: Fransisco Hertz, MD;  Location: [facility] OR;  Service: Vascular;  Laterality: Left;  Ultrasound guided  . Arteriovenous graft placement    . Laparoscopic partial hepatectomy Right 07/30/12@ [facility]- notes are  in [place]  . Av fistula placement Left 02/01/2013    Procedure: INSERTION OF ARTERIOVENOUS (AV) GORE-TEX GRAFT ARM;  Surgeon: Sherren Kerns, MD;  Location: [facility] OR;  Service: Vascular;  Laterality: Left;  . Fistulogram Left 04/06/2012    Procedure: FISTULOGRAM;  Surgeon: [name], MD;  Location: [facility] CATH LAB;  Service: Cardiovascular;  Laterality: Left;  . Shuntogram N/A 06/01/2012    Procedure: Betsey Amen;  Surgeon: [name], MD;  Location: [facility] CATH LAB;  Service: Cardiovascular;  Laterality: N/A;   Family History  Problem Relation Age of Onset  . Diabetes    . Kidney disease    . Heart disease    . Stroke     Social History:  reports that he has never smoked. He quit smokeless tobacco use about 5 years ago. His smokeless tobacco use included Chew. He reports that he does not drink alcohol or use illicit drugs. No Known Allergies Prior to Admission medications   Medication Sig Start Date End Date Taking? Authorizing  Provider  hydrOXYzine (ATARAX/VISTARIL) 25 MG tablet Take 25 mg by mouth daily. 01/05/15  Yes Historical Provider, MD  mirtazapine (REMERON) 15 MG tablet Take 15 mg by mouth daily. 01/17/15  Yes Historical Provider, MD  rOPINIRole (REQUIP) 0.5 MG tablet Take 0.5 mg by mouth at bedtime. 09/08/14  Yes Historical Provider, MD  allopurinol (ZYLOPRIM) 100 MG tablet Take 100 mg by mouth daily.  02/20/11   Historical Provider, MD  atorvastatin (LIPITOR) 40 MG tablet Take 40 mg by mouth daily. 02/20/11   Iran Ouch, MD  benzonatate (TESSALON) 200 MG capsule Take 200 mg by mouth 3 (three) times daily as needed for cough.    Historical Provider, MD  calcitRIOL (ROCALTROL) 0.5 MCG capsule Take 0.5 mcg by mouth daily.  09/03/12   Historical  Provider, MD  calcium acetate (PHOSLO) 667 MG capsule Take by mouth. 01/30/15   Historical Provider, MD  citalopram (CELEXA) 40 MG tablet Take by mouth daily.  12/07/12   Historical Provider, MD  clopidogrel (PLAVIX) 75 MG tablet Take 75 mg by mouth daily.    Historical Provider, MD  divalproex (DEPAKOTE ER) 500 MG 24 hr tablet Take 1 tablet (500 mg total) by mouth daily. 11/16/12   Micki Riley, MD  DOCOSAHEXAENOIC ACID PO Take 50,000 Units by mouth.    Historical Provider, MD  finasteride (PROSCAR) 5 MG tablet Take 5 mg by mouth daily.  12/07/12   Historical Provider, MD  furosemide (LASIX) 80 MG tablet Take 80 mg by mouth 2 (two) times daily. 11/16/14   Historical Provider, MD  hydrOXYzine (ATARAX/VISTARIL) 25 MG tablet Take 25 mg by mouth at bedtime. 01/05/15   Historical Provider, MD  midodrine (PROAMATINE) 5 MG tablet Take by mouth 2 (two) times daily.  12/17/12   Historical Provider, MD  mirtazapine (REMERON) 15 MG tablet Take 15 mg by mouth daily. 01/17/15   Historical Provider, MD  multivitamin (RENA-VIT) TABS tablet Take 1 tablet by mouth daily. 09/09/12   Laveda Norman, MD  NAMENDA 10 MG tablet Take 10 mg by mouth 2 (two) times daily.  02/20/11   Historical Provider, MD  NAMENDA XR 28 MG CP24 24 hr capsule Take 28 mg by mouth daily. 11/15/14   Historical Provider, MD  omeprazole (PRILOSEC) 20 MG capsule Take 20 mg by mouth daily. 01/17/15   Historical Provider, MD  oxyCODONE (ROXICODONE) 5 MG immediate release tablet Take 1-2 tablets (5-10 mg total) by mouth every 4 (four) hours as needed for pain. 02/01/13   Marlowe Shores, PA-C  oxyCODONE-acetaminophen (PERCOCET/ROXICET) 5-325 MG per tablet  02/06/15   Historical Provider, MD  ranitidine (ZANTAC) 150 MG tablet Take 150 mg by mouth 2 (two) times daily.    Historical Provider, MD  ranitidine (ZANTAC) 300 MG tablet Take 300 mg by mouth daily. 11/07/14   Historical Provider, MD   Current Facility-Administered Medications  Medication Dose Route  Frequency Provider Last Rate Last Dose  . acetaminophen (TYLENOL) tablet 650 mg  650 mg Oral Q6H PRN Zannie Cove, MD       Or  . acetaminophen (TYLENOL) suppository 650 mg  650 mg Rectal Q6H PRN Zannie Cove, MD      . Melene Muller ON 2015-02-16] allopurinol (ZYLOPRIM) tablet 100 mg  100 mg Oral Daily Zannie Cove, MD      . atorvastatin (LIPITOR) tablet 40 mg  40 mg Oral q1800 Zannie Cove, MD      . Melene Muller ON 2015-02-16] citalopram (CELEXA) tablet 40 mg  40 mg Oral Daily Zannie Cove, MD      . Melene Muller ON 02/05/2015] clopidogrel (PLAVIX) tablet 75 mg  75 mg Oral Daily Zannie Cove, MD      . Melene Muller ON 02/03/2015] divalproex (DEPAKOTE ER) 24 hr tablet 500 mg  500 mg Oral Daily Zannie Cove, MD      . famotidine (PEPCID) tablet 20 mg  20 mg Oral BID Zannie Cove, MD      . Melene Muller ON 02/08/2015] finasteride (PROSCAR) tablet 5 mg  5 mg Oral Daily Zannie Cove, MD      . heparin injection 5,000 Units  5,000 Units Subcutaneous 3 times per day Zannie Cove, MD      . memantine [facility]) tablet 10 mg  10 mg Oral BID Zannie Cove, MD      . midodrine (PROAMATINE) tablet 5 mg  5 mg Oral BID WC Zannie Cove, MD      . multivitamin (RENA-VIT) tablet 1 tablet  1 tablet Oral QHS Zannie Cove, MD      . ondansetron [facility]) tablet 4 mg  4 mg Oral Q6H PRN Zannie Cove, MD       Or  . ondansetron (ZOFRAN) injection 4 mg  4 mg Intravenous Q6H PRN Zannie Cove, MD      . oxyCODONE (Oxy IR/ROXICODONE) immediate release tablet 5 mg  5 mg Oral Q6H PRN Zannie Cove, MD       Labs: Basic Metabolic Panel:  Recent Labs Lab 02-23-2015 1220  NA 137  K 3.7  CL 100*  CO2 27  GLUCOSE 131*  BUN 30*  CREATININE 6.69*  CALCIUM 9.5   Liver Function Tests: No results for input(s): AST, ALT, ALKPHOS, BILITOT, PROT, ALBUMIN in the last 168 hours. No results for input(s): LIPASE, AMYLASE in the last 168 hours. No results for input(s): AMMONIA in the last 168 hours. CBC:  Recent Labs Lab  02-23-2015 1220  WBC 5.9  NEUTROABS 4.7  HGB 7.3*  HCT 22.5*  MCV 97.0  PLT 127*   Cardiac Enzymes: No results for input(s): CKTOTAL, CKMB, CKMBINDEX, TROPONINI in the last 168 hours. CBG: No results for input(s): GLUCAP in the last 168 hours. Iron Studies: No results for input(s): IRON, TIBC, TRANSFERRIN, FERRITIN in the last 72 hours. Studies/Results: Dg Chest Port 1 View  February 23, 2015   CLINICAL DATA:  Shortness of breath, history coronary artery disease post MI and CABG, stroke, hypertension, diabetes, end-stage renal disease  EXAM: PORTABLE CHEST - 1 VIEW  COMPARISON:  Portable exam 1200 hours compared 02/08/2015  FINDINGS: Enlargement of cardiac silhouette post CABG.  RIGHT subclavian stent again identified.  Additional stents identified both upper extremities.  Atherosclerotic calcification aorta.  Bibasilar atelectasis.  No definite infiltrate pleural effusion or pneumothorax.  No acute osseous findings.  IMPRESSION: Enlargement of cardiac silhouette post CABG.  Bibasilar atelectasis.   Electronically Signed   By: Ulyses Southward M.D.   On: 2015/02/23 12:16    Review of Systems: Gen: Denies any fever, chills, sweats. Reports chronic weakness. HEENT: Facial swelling CV: Denies chest pain, angina, palpitations, syncope, orthopnea, PND, peripheral edema, and claudication. Resp: reports chronic dyspnea with exercise. Denies dyspnea at rest, , cough, sputum, wheezing, coughing up blood, and pleurisy. GI: Denies vomiting blood, jaundice, and fecal incontinence.   Denies dysphagia or odynophagia. Eating well. Denies blood in stool GU : Denies urinary burning, blood in urine, urinary frequency, urinary hesitancy, nocturnal urination, and urinary incontinence.  No renal calculi. MS: Denies joint pain, limitation  of movement, and swelling, stiffness, low back pain, extremity pain. Denies muscle weakness, cramps, atrophy.  No use of non steroidal antiinflammatory drugs. Derm: Denies rash, itching,  dry skin, hives, moles, warts, or unhealing ulcers.  Psych: Denies depression, anxiety, memory loss, suicidal ideation, hallucinations, paranoia, and confusion. Heme: prolonged bleeding from AVG last weekend and one episode of bleeding that occurred at home after HD - pre wife lost a lot of blood Neuro: No headache.  No diplopia. No dysarthria.  No dysphasia.  No history of CVA.  No Seizures. No paresthesias.  No weakness. Endocrine No DM.  No Thyroid disease.  No Adrenal disease.  Physical Exam: Filed Vitals:   02/08/2015 1230 01/30/2015 1330 02/22/2015 1335 02/04/2015 1400  BP: 104/74 139/66  104/50  Pulse: 83  72   Resp: SpO2: 100%  100%      General: Well developed, well nourished, in no acute distress. Head: Facial edema Neck: Supple. JVD not elevated. Lungs: Clear bilaterally to auscultation without wheezes, rales, or rhonchi. Breathing is unlabored. Heart: RRR with S1 S2. No murmurs, rubs, or gallops appreciated. Abdomen: Soft, non-tender, non-distended with normoactive bowel sounds. No rebound/guarding. No obvious abdominal masses. M-S:  Strength and tone appear normal for age. Lower extremities: L arm edema. No LE edema Neuro: Alert and oriented X 3. Moves all extremities spontaneously. Psych:  Responds to questions appropriately with a normal affect. Dialysis Access: R thigh cath placed 6/16 CKV Dr Juel Burrow  Dialysis Orders:  MWF [facility]  4 hour     78 kgs       3K/2.25ca    No heparin     Micera 150 q 2weeks- last dose 6/8 Calcitriol 0.5 q HD   Assessment/Plan: 1.  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 CT and venogram pending. Vasc and IR consulted. Per Dr Juel Burrow at [facility]- central stent on right and left innominate vein stenosis 2. Hypoxia- resolved. sats 100% denies sob.  3.  ESRD -  HD pending tomorrow. K +3.7 on a 3 K bath 4.  Hypertension/volume  - 104/50 no meds. Gets to edw/gains controlled 5.  Anemia  - hgb 7.3/ repeat 8.2- slow drop 7.8 on 6/15, 8.1 on 6/8    9.2 on 6/1- Micera 150 last given 6/8- give ESA dose tomorrow. Had prolonged bleeding post HD with significant blood loss one day. Last tsat 39/ transfuse PRN <7 6.  Metabolic bone disease -  last phos 3 and PTH 410. Cont calcitriol. phoslo with meals 7.  Nutrition - renal diet. Vitamin 8. Fever- pt reported fever of 100.4 on 6/14 after placement of IJ cath/ he was afebrile at HD. Blood cultures were done - no growth to date. Pt received 1 dose of Vanc s on 6/14.- remains afebrile and WBC 5.6/ no signs of infection  Jetty Duhamel, NP [employer] [phone] 01/30/2015, 3:41 PM   Pt seen, examined and agree w A/P as above.  Vinson Moselle MD pager [phone]    cell [phone] 02/23/2015, 5:37 PM

## 2015-02-09 NOTE — Progress Notes (Addendum)
Radiologist called to inform that the result from the CT are back and he wanted the on call doctor to see the results.   Called the on call doctor Dr. Craige Cotta and left the phone number to call back.

## 2015-02-09 NOTE — ED Notes (Addendum)
Family at bedside, Dr. Clydene Pugh notified and at bedside.

## 2015-02-09 NOTE — ED Provider Notes (Signed)
CSN: [id number]     Arrival date & time 2015/03/02  1134 History   First MD Initiated Contact with Patient 03/02/15 1136     Chief Complaint  Patient presents with  . Facial Swelling  . Shortness of Breath     (Consider location/radiation/quality/duration/timing/severity/associated sxs/prior Treatment) Patient is a 81 y.o. male presenting with general illness. The history is provided by the EMS personnel and a relative.  Illness Location:  Head and neck Quality:  Swelling plus fatigue, inability to sleep at night Severity:  Moderate Onset quality:  Gradual Duration:  1 week Timing:  Constant Progression:  Unchanged Chronicity:  New Context:  Had oxygen desaturation when exchanging line after receiving fentanyl Associated symptoms: shortness of breath (progressive)   Associated symptoms: no abdominal pain, no chest pain, no fever and no loss of consciousness     Past Medical History  Diagnosis Date  . Coronary artery disease   . Myocardial infarction     according to patient  . CVA (cerebral vascular accident)     2 mini strokes  . Arthritis   . GERD (gastroesophageal reflux disease)   . H/O hiatal hernia   . Memory loss   . Hypertension     not on medication  . Shortness of breath     with exertion  . Diabetes mellitus     not on medications  . Depression   . Pneumonia     stayed at [facility]  . Chronic kidney disease     on kidney transplant list  . ESRD (end stage renal disease)     Dialysis T/T/S   Past Surgical History  Procedure Laterality Date  . Dialysis fistula creation    . Coronary artery bypass graft  2002  . Av fistula placement  01/16/2012    Left BVT   . Venoplasty  04/06/2012    Left Basilic Vein Stenosis  . Av fistula placement Left 10/02/2012    Procedure: INSERTION OF ARTERIOVENOUS (AV) GORE-TEX GRAFT ARM;  Surgeon: [name], MD;  Location: [facility] OR;  Service: Vascular;  Laterality: Left;  . Thrombectomy and revision of arterioventous  (av) goretex  graft Left 12/16/2012    Procedure: THROMBECTOMY AND REVISION OF ARTERIOVENTOUS (AV) GORETEX  GRAFT;  Surgeon: Fransisco Hertz, MD;  Location: [facility] OR;  Service: Vascular;  Laterality: Left;  Ultrasound guided  . Arteriovenous graft placement    . Laparoscopic partial hepatectomy Right 07/30/12@ [facility]- notes are  in [place]  . Av fistula placement Left 02/01/2013    Procedure: INSERTION OF ARTERIOVENOUS (AV) GORE-TEX GRAFT ARM;  Surgeon: Sherren Kerns, MD;  Location: [facility] OR;  Service: Vascular;  Laterality: Left;  . Fistulogram Left 04/06/2012    Procedure: FISTULOGRAM;  Surgeon: [name], MD;  Location: [facility] CATH LAB;  Service: Cardiovascular;  Laterality: Left;  . Shuntogram N/A 06/01/2012    Procedure: Betsey Amen;  Surgeon: [name], MD;  Location: [facility] CATH LAB;  Service: Cardiovascular;  Laterality: N/A;   Family History  Problem Relation Age of Onset  . Diabetes    . Kidney disease    . Heart disease    . Stroke     History  Substance Use Topics  . Smoking status: Never Smoker   . Smokeless tobacco: Former Neurosurgeon    Types: Chew    Quit date: 08/26/2009  . Alcohol Use: No    Review of Systems  Constitutional: Negative for fever.  Respiratory: Positive for shortness of breath (  progressive).   Cardiovascular: Negative for chest pain.  Gastrointestinal: Negative for abdominal pain.  Neurological: Negative for loss of consciousness.  All other systems reviewed and are negative.     Allergies  Review of patient's allergies indicates no known allergies.  Home Medications   Prior to Admission medications   Medication Sig Start Date End Date Taking? Authorizing Provider  allopurinol (ZYLOPRIM) 100 MG tablet Take 100 mg by mouth daily.  02/20/11   Historical Provider, MD  atorvastatin (LIPITOR) 40 MG tablet Take 40 mg by mouth daily. 02/20/11   Iran Ouch, MD  benzonatate (TESSALON) 200 MG capsule Take 200 mg by mouth 3 (three) times daily as  needed for cough.    Historical Provider, MD  calcitRIOL (ROCALTROL) 0.5 MCG capsule Take 0.5 mcg by mouth daily.  09/03/12   Historical Provider, MD  citalopram (CELEXA) 40 MG tablet Take by mouth daily.  12/07/12   Historical Provider, MD  clopidogrel (PLAVIX) 75 MG tablet Take 75 mg by mouth daily.    Historical Provider, MD  divalproex (DEPAKOTE ER) 500 MG 24 hr tablet Take 1 tablet (500 mg total) by mouth daily. 11/16/12   Micki Riley, MD  finasteride (PROSCAR) 5 MG tablet Take 5 mg by mouth daily.  12/07/12   Historical Provider, MD  midodrine (PROAMATINE) 5 MG tablet Take by mouth 2 (two) times daily.  12/17/12   Historical Provider, MD  multivitamin (RENA-VIT) TABS tablet Take 1 tablet by mouth daily. 09/09/12   Laveda Norman, MD  NAMENDA 10 MG tablet Take 10 mg by mouth 2 (two) times daily.  02/20/11   Historical Provider, MD  oxyCODONE (ROXICODONE) 5 MG immediate release tablet Take 1-2 tablets (5-10 mg total) by mouth every 4 (four) hours as needed for pain. 02/01/13   Regina J Roczniak, PA-C  ranitidine (ZANTAC) 150 MG tablet Take 150 mg by mouth 2 (two) times daily.    Historical Provider, MD   BP 104/50 mmHg  Pulse 72  Resp 12  SpO2 100% Physical Exam  Constitutional: He appears well-developed and well-nourished. He appears lethargic. He has a sickly appearance. No distress.  HENT:  Head: Normocephalic and atraumatic.  Nonpitting edema of the neck and head  Eyes: Conjunctivae are normal.  Neck: Neck supple. No tracheal deviation present.  Cardiovascular: Normal rate, regular rhythm and normal heart sounds.   Pulmonary/Chest: Effort normal and breath sounds normal. No respiratory distress.  Abdominal: Soft. He exhibits no distension.  Neurological: He has normal strength. He appears lethargic. GCS eye subscore is 3. GCS verbal subscore is 4. GCS motor subscore is 6.  Skin: Skin is warm and dry.  Psychiatric: He has a normal mood and affect.    ED Course  Procedures (including  critical care time) Labs Review Labs Reviewed  CBC WITH DIFFERENTIAL/PLATELET - Abnormal; Notable for the following:    RBC 2.32 (*)    Hemoglobin 7.3 (*)    HCT 22.5 (*)    RDW 16.2 (*)    Platelets 127 (*)    Neutrophils Relative % 79 (*)    All other components within normal limits  BASIC METABOLIC PANEL - Abnormal; Notable for the following:    Chloride 100 (*)    Glucose, Bld 131 (*)    BUN 30 (*)    Creatinine, Ser 6.69 (*)    GFR calc non Af Amer 7 (*)    GFR calc Af Amer 8 (*)    All other components  within normal limits  PROTIME-INR - Abnormal; Notable for the following:    Prothrombin Time 16.3 (*)    All other components within normal limits  APTT - Abnormal; Notable for the following:    aPTT 45 (*)    All other components within normal limits  POC OCCULT BLOOD, ED    Imaging Review Dg Chest Port 1 View  02/07/2015   CLINICAL DATA:  Shortness of breath, history coronary artery disease post MI and CABG, stroke, hypertension, diabetes, end-stage renal disease  EXAM: PORTABLE CHEST - 1 VIEW  COMPARISON:  Portable exam 1200 hours compared 02/08/2015  FINDINGS: Enlargement of cardiac silhouette post CABG.  RIGHT subclavian stent again identified.  Additional stents identified both upper extremities.  Atherosclerotic calcification aorta.  Bibasilar atelectasis.  No definite infiltrate pleural effusion or pneumothorax.  No acute osseous findings.  IMPRESSION: Enlargement of cardiac silhouette post CABG.  Bibasilar atelectasis.   Electronically Signed   By: Ulyses Southward M.D.   On: 02/05/2015 12:16     EKG Interpretation   Date/Time:  Thursday February 09 2015 11:41:57 EDT Ventricular Rate:  82 PR Interval:  155 QRS Duration: 137 QT Interval:  457 QTC Calculation: 534 R Axis:   33 Text Interpretation:  Sinus rhythm Right bundle branch block Baseline  wander in lead(s) V6 No significant change since last tracing Confirmed by  GOLDSTON  MD, SCOTT ([id number]) on 02/13/2015 11:46:51  AM      MDM   Final diagnoses:  Shortness of breath    81 year old male presents with fatigue, headache and neck swelling, and multiple thrombotic complications of a right upper extremity fistula and left IJ line used for access for dialysis. He went for exchange today and had a right femoral line placed for dialysis access but had a desaturation event following the administration of fentanyl for the procedure. 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  This is to clean complicated individual is further complicated by anemia that is new over the last week, sleeping poorly and the need for anticoagulation to prevent thrombus extension or embolism. Hospitalist was consulted and will see the patient in the emergency department for admission, they requested vascular surgery consultation. Vascular surgery requested interventional radiology be consulted for evaluation of trouble lysis and agreed with heparinization. IR agreed to come see the patient.    Lyndal Pulley, MD 01/26/2015 1439  Pricilla Loveless, MD 02/16/2015 [phone]

## 2015-02-09 NOTE — Consult Note (Signed)
Consult Note  Patient name: Shane [name] MRN: [id number] DOB: 24-Jan-1931 Sex: male  Consulting Physician:  Renal  Reason for Consult:  Chief Complaint  Patient presents with  . Facial Swelling  . Shortness of Breath    HISTORY OF PRESENT ILLNESS: This is a 81 year old male who was sent form [facility] to the ED with concerns for SVC syndrome.  He reportedly had a period of hypoxia after fentanyl   His history is provided by his family as the patient suffers from dementia.  The patient has had multiple recent procedures including declotting and thrombectomy of his left arm AV fistula.  He had a left sided catheter placed.  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  In addition he is also noted to be anemic with hemoglobin of 7.3, his wife denies any melena, hematemesis or hematochezia.  He has a history of hypertension.  He has had 2 mini-strokes.  He suffers from diabetes but is not on any medications  Past Medical History  Diagnosis Date  . Coronary artery disease   . Myocardial infarction     according to patient  . CVA (cerebral vascular accident)     2 mini strokes  . Arthritis   . GERD (gastroesophageal reflux disease)   . H/O hiatal hernia   . Memory loss   . Hypertension     not on medication  . Shortness of breath     with exertion  . Diabetes mellitus     not on medications  . Depression   . Pneumonia     stayed at [facility]  . Chronic kidney disease     on kidney transplant list  . ESRD (end stage renal disease)     Dialysis T/T/S  . Anemia 03-03-15    Past Surgical History  Procedure Laterality Date  . Dialysis fistula creation    . Coronary artery bypass graft  2002  . Av fistula placement  01/16/2012    Left BVT   . Venoplasty  04/06/2012      Left Basilic Vein Stenosis  . Av fistula placement Left 10/02/2012    Procedure: INSERTION OF ARTERIOVENOUS (AV) GORE-TEX GRAFT ARM;  Surgeon: [name], MD;  Location: [facility] OR;  Service: Vascular;  Laterality: Left;  . Thrombectomy and revision of arterioventous (av) goretex  graft Left 12/16/2012    Procedure: THROMBECTOMY AND REVISION OF ARTERIOVENTOUS (AV) GORETEX  GRAFT;  Surgeon: Fransisco Hertz, MD;  Location: [facility] OR;  Service: Vascular;  Laterality: Left;  Ultrasound guided  . Arteriovenous graft placement    . Laparoscopic partial hepatectomy Right 07/30/12@ [facility]- notes are  in [place]  . Av fistula placement Left 02/01/2013    Procedure: INSERTION OF ARTERIOVENOUS (AV) GORE-TEX GRAFT ARM;  Surgeon: Sherren Kerns, MD;  Location: [facility] OR;  Service: Vascular;  Laterality: Left;  . Fistulogram Left 04/06/2012    Procedure: FISTULOGRAM;  Surgeon: [name], MD;  Location: [facility] CATH LAB;  Service: Cardiovascular;  Laterality: Left;  . Shuntogram N/A 06/01/2012    Procedure: Betsey Amen;  Surgeon: [name], MD;  Location: [facility] CATH LAB;  Service: Cardiovascular;  Laterality: N/A;    History   Social History  .  Marital Status: Married    Spouse Name: N/A  . Number of Children: 5  . Years of Education: N/A   Occupational History  . retired    Social History Main Topics  . Smoking status: Never Smoker   . Smokeless tobacco: Former Neurosurgeon    Types: Chew    Quit date: 08/26/2009  . Alcohol Use: No  . Drug Use: No  . Sexual Activity: Not on file   Other Topics Concern  . Not on file   Social History Narrative    Family History  Problem Relation Age of Onset  . Diabetes    . Kidney disease    . Heart disease    . Stroke      Allergies as of 02/18/2015  . (No Known Allergies)    No current facility-administered medications on file prior to encounter.   Current Outpatient Prescriptions on File Prior to Encounter  Medication Sig Dispense Refill   . clopidogrel (PLAVIX) 75 MG tablet Take 75 mg by mouth daily.    . divalproex (DEPAKOTE ER) 500 MG 24 hr tablet Take 1 tablet (500 mg total) by mouth daily. (Patient taking differently: Take 1,000 mg by mouth daily. ) 30 tablet 3  . multivitamin (RENA-VIT) TABS tablet Take 1 tablet by mouth daily. 30 tablet 0  . calcitRIOL (ROCALTROL) 0.5 MCG capsule Take 0.5 mcg by mouth every Monday, Wednesday, and Friday with hemodialysis.        REVIEW OF SYSTEMS:  Review of Systems: Unable to obtain due to dementia  PHYSICAL EXAMINATION: General: The patient appears their stated age.  Vital signs are BP 123/55 mmHg  Pulse 60  Temp(Src) 98.2 F (36.8 C) (Oral)  Resp 15  SpO2 100% Pulmonary: Respirations are non-labored HEENT:  Central swelling Abdomen: Soft and non-tender  Musculoskeletal: There are no major deformities.   Neurologic: No focal weakness or paresthesias are detected, Skin: There are no ulcer or rashes noted. Psychiatric: The patient has normal affect. Cardiovascular: There is a regular rate and rhythm without significant murmur appreciated.  Thrombosed right sided AVF/AVGG  Diagnostic Studies: none    Assessment:  Possible SVC syndrome Plan: Unfortunately, the patient does not have any outside records or images that I can review to evaluate his central venous system.  Therefore, I am going to order a CTA to evaluate for thrombus.  I would also recommend consult to IR for possible thrombolysis if he has thrombus on his CTA     V. Charlena Cross, M.D. [facility] Office: [phone] Pager:  [phone]

## 2015-02-09 NOTE — Consult Note (Signed)
Chief Complaint: Chief Complaint  Patient presents with  . Facial Swelling  . Shortness of Breath  ESRD  Referring Physician(s): [facility]  History of Present Illness: Shane [name] is a 81 y.o. male   Pt on HD x 2 yrs Has used arm grafts and chest caths when needed Most recently Rt arm graft in use and did well after interventions with [facility] Dr Juel Burrow Was "declotted" 6/6 and again 6/8- yet still not running well New L tunneled IJ catheter was placed 6/14 Noted continued face and arm swelling - worsening today Left IJ tunneled cath removed today and Rt femoral catheter placed by Dr Juel Burrow Has been sent to ED for evaluation and possible intervention regarding facial and arm swelling- probable SVC syndrome Now scheduled for superior vena cava venogram with possible thrombolysis/throbectomy with possible angioplasty/stent    Past Medical History  Diagnosis Date  . Coronary artery disease   . Myocardial infarction     according to patient  . CVA (cerebral vascular accident)     2 mini strokes  . Arthritis   . GERD (gastroesophageal reflux disease)   . H/O hiatal hernia   . Memory loss   . Hypertension     not on medication  . Shortness of breath     with exertion  . Diabetes mellitus     not on medications  . Depression   . Pneumonia     stayed at [facility]  . Chronic kidney disease     on kidney transplant list  . ESRD (end stage renal disease)     Dialysis T/T/S    Past Surgical History  Procedure Laterality Date  . Dialysis fistula creation    . Coronary artery bypass graft  2002  . Av fistula placement  01/16/2012    Left BVT   . Venoplasty  04/06/2012    Left Basilic Vein Stenosis  . Av fistula placement Left 10/02/2012    Procedure: INSERTION OF ARTERIOVENOUS (AV) GORE-TEX GRAFT ARM;  Surgeon: [name], MD;  Location: [facility] OR;  Service: Vascular;  Laterality: Left;  . Thrombectomy and revision of arterioventous (av) goretex  graft Left 12/16/2012    Procedure: THROMBECTOMY AND REVISION OF ARTERIOVENTOUS (AV) GORETEX  GRAFT;  Surgeon: Fransisco Hertz, MD;  Location: [facility] OR;  Service: Vascular;  Laterality: Left;  Ultrasound guided  . Arteriovenous graft placement    . Laparoscopic partial hepatectomy Right 07/30/12@ [facility]- notes are  in [place]  . Av fistula placement Left 02/01/2013    Procedure: INSERTION OF ARTERIOVENOUS (AV) GORE-TEX GRAFT ARM;  Surgeon: Sherren Kerns, MD;  Location: [facility] OR;  Service: Vascular;  Laterality: Left;  . Fistulogram Left 04/06/2012    Procedure: FISTULOGRAM;  Surgeon: [name], MD;  Location: [facility] CATH LAB;  Service: Cardiovascular;  Laterality: Left;  . Shuntogram N/A 06/01/2012    Procedure: Betsey Amen;  Surgeon: [name], MD;  Location: [facility] CATH LAB;  Service: Cardiovascular;  Laterality: N/A;    Allergies: Review of patient's allergies indicates no known allergies.  Medications: Prior to Admission medications   Medication Sig Start Date End Date Taking? Authorizing Provider  divalproex (DEPAKOTE ER) 500 MG 24 hr tablet Take 1,000 mg by mouth daily. 10/04/14 10/04/15 Yes Historical Provider, MD  hydrOXYzine (ATARAX/VISTARIL) 25 MG tablet Take 25 mg by mouth daily. 01/05/15  Yes Historical Provider, MD  memantine (NAMENDA) 10 MG tablet Take 10 mg by mouth daily. 12/28/12  Yes Historical Provider,  MD  mirtazapine (REMERON) 15 MG tablet Take 15 mg by mouth daily. 01/17/15  Yes Historical Provider, MD  rOPINIRole (REQUIP) 0.5 MG tablet Take 0.5 mg by mouth at bedtime. 09/08/14  Yes Historical Provider, MD  allopurinol (ZYLOPRIM) 100 MG tablet Take 100 mg by mouth daily.  02/20/11   Historical Provider, MD  atorvastatin (LIPITOR) 40 MG tablet Take 40 mg by mouth daily. 02/20/11   Iran Ouch, MD  benzonatate (TESSALON) 200 MG capsule Take 200 mg by mouth 3 (three) times daily as needed for cough.    Historical Provider, MD  calcitRIOL (ROCALTROL) 0.5 MCG capsule Take 0.5 mcg by mouth daily.   09/03/12   Historical Provider, MD  calcium acetate (PHOSLO) 667 MG capsule Take by mouth. 01/30/15   Historical Provider, MD  citalopram (CELEXA) 40 MG tablet Take by mouth daily.  12/07/12   Historical Provider, MD  clopidogrel (PLAVIX) 75 MG tablet Take 75 mg by mouth daily.    Historical Provider, MD  divalproex (DEPAKOTE ER) 500 MG 24 hr tablet Take 1 tablet (500 mg total) by mouth daily. 11/16/12   Micki Riley, MD  DOCOSAHEXAENOIC ACID PO Take 50,000 Units by mouth.    Historical Provider, MD  finasteride (PROSCAR) 5 MG tablet Take 5 mg by mouth daily.  12/07/12   Historical Provider, MD  furosemide (LASIX) 80 MG tablet Take 80 mg by mouth 2 (two) times daily. 11/16/14   Historical Provider, MD  hydrOXYzine (ATARAX/VISTARIL) 25 MG tablet Take 25 mg by mouth at bedtime. 01/05/15   Historical Provider, MD  midodrine (PROAMATINE) 5 MG tablet Take by mouth 2 (two) times daily.  12/17/12   Historical Provider, MD  mirtazapine (REMERON) 15 MG tablet Take 15 mg by mouth daily. 01/17/15   Historical Provider, MD  multivitamin (RENA-VIT) TABS tablet Take 1 tablet by mouth daily. 09/09/12   Laveda Norman, MD  NAMENDA 10 MG tablet Take 10 mg by mouth 2 (two) times daily.  02/20/11   Historical Provider, MD  NAMENDA XR 28 MG CP24 24 hr capsule Take 28 mg by mouth daily. 11/15/14   Historical Provider, MD  omeprazole (PRILOSEC) 20 MG capsule Take 20 mg by mouth daily. 01/17/15   Historical Provider, MD  oxyCODONE (ROXICODONE) 5 MG immediate release tablet Take 1-2 tablets (5-10 mg total) by mouth every 4 (four) hours as needed for pain. 02/01/13   Marlowe Shores, PA-C  oxyCODONE-acetaminophen (PERCOCET/ROXICET) 5-325 MG per tablet  02/06/15   Historical Provider, MD  ranitidine (ZANTAC) 150 MG tablet Take 150 mg by mouth 2 (two) times daily.    Historical Provider, MD  ranitidine (ZANTAC) 150 MG tablet Take 300 mg by mouth daily.    Historical Provider, MD  ranitidine (ZANTAC) 300 MG tablet Take 300 mg by mouth daily.  11/07/14   Historical Provider, MD     Family History  Problem Relation Age of Onset  . Diabetes    . Kidney disease    . Heart disease    . Stroke      History   Social History  . Marital Status: Married    Spouse Name: N/A  . Number of Children: 5  . Years of Education: N/A   Occupational History  . retired    Social History Main Topics  . Smoking status: Never Smoker   . Smokeless tobacco: Former Neurosurgeon    Types: Chew    Quit date: 08/26/2009  . Alcohol Use: No  .  Drug Use: No  . Sexual Activity: Not on file   Other Topics Concern  . None   Social History Narrative     Review of Systems: A 12 point ROS discussed and pertinent positives are indicated in the HPI above.  All other systems are negative.  Review of Systems  Constitutional: Positive for activity change, appetite change and fatigue. Negative for unexpected weight change.  HENT: Positive for facial swelling and hearing loss.   Respiratory: Positive for shortness of breath. Negative for cough.   Gastrointestinal: Negative for abdominal pain.  Neurological: Positive for weakness.    Vital Signs: BP 104/50 mmHg  Pulse 72  Resp 12  SpO2 100%  Physical Exam  Constitutional: He appears well-nourished.  HENT:  Facial swelling  Cardiovascular: Normal rate and normal heart sounds.   No murmur heard. Pulmonary/Chest: Effort normal. He has wheezes.  Abdominal: Soft. Bowel sounds are normal. There is no tenderness.  Musculoskeletal: Normal range of motion. He exhibits edema.  Left arm more swelling than Rt  Neurological: He is alert.  Skin: Skin is warm and dry.  Psychiatric: He has a normal mood and affect. His behavior is normal. Judgment and thought content normal.  Nursing note and vitals reviewed.   Mallampati Score:  MD Evaluation Airway: WNL Heart: WNL Abdomen: WNL Chest/ Lungs: WNL ASA  Classification: 3 Mallampati/Airway Score: Two  Imaging: Dg Chest Port 1 View  01/28/2015    CLINICAL DATA:  Shortness of breath, history coronary artery disease post MI and CABG, stroke, hypertension, diabetes, end-stage renal disease  EXAM: PORTABLE CHEST - 1 VIEW  COMPARISON:  Portable exam 1200 hours compared 02/08/2015  FINDINGS: Enlargement of cardiac silhouette post CABG.  RIGHT subclavian stent again identified.  Additional stents identified both upper extremities.  Atherosclerotic calcification aorta.  Bibasilar atelectasis.  No definite infiltrate pleural effusion or pneumothorax.  No acute osseous findings.  IMPRESSION: Enlargement of cardiac silhouette post CABG.  Bibasilar atelectasis.   Electronically Signed   By: Ulyses Southward M.D.   On: 02/13/2015 12:16    Labs:  CBC:  Recent Labs  09/21/14 2333 02/17/2015 1220  WBC 6.0 5.9  HGB 11.0* 7.3*  HCT 35.3* 22.5*  PLT 83* 127*    COAGS:  Recent Labs  02/05/2015 1220  INR 1.30  APTT 45*    BMP:  Recent Labs  09/21/14 2333 01/28/2015 1220  NA 136 137  K 3.9 3.7  CL 99 100*  CO2 28 27  GLUCOSE 137* 131*  BUN 24* 30*  CALCIUM 9.2 9.5  CREATININE 6.36* 6.69*  GFRNONAA 7* 7*  GFRAA 8* 8*    LIVER FUNCTION TESTS:  Recent Labs  09/21/14 2333  BILITOT 0.6  AST 15  ALT 11  ALKPHOS 74  PROT 6.6  ALBUMIN 3.1*    TUMOR MARKERS: No results for input(s): AFPTM, CEA, CA199, CHROMGRNA in the last 8760 hours.  Assessment and Plan:  ESRD Tunneled dialysis cath placed by Dr Juel Burrow at [facility] 6/14 Facial swelling over last 2 days Cath removed today--new Rt femoral cath placed Sent for eval and treatment for probable SVC syndrome Now scheduled for SVC venogram with thrombolysis/thrombectomy with poss pta/stent Risks and Benefits discussed with the patient including, but not limited to bleeding, possible life threatening bleeding and need for blood product transfusion, vascular injury, stroke and infection. All of the patient's questions were answered, patient is agreeable to proceed. Consent signed and in  chart.   Thank you for this interesting  consult.  I greatly enjoyed meeting OSHAY BJORK and look forward to participating in their care.  Signed: Shakoya Gilmore A 02/03/2015, 3:44 PM   I spent a total of 40 Minutes    in face to face in clinical consultation, greater than 50% of which was counseling/coordinating care for SVC venogram/lysis

## 2015-02-09 NOTE — H&P (Signed)
[facility] History and Physical  Shane [name] RWE:[id number] DOB: 03/24/1931 DOA: 02-20-15  Referring physician: EDP PCP: [name]., MD   Chief Complaint: low O2 after procedure and SVC syndrome  HPI: Shane [name] is a 81 y.o. male with Complicated past medical history significant for ESRD on hemodialysis, extensive access problems, dementia with confusion, status post CABG had a new groin HD catheter placed and his left tunneled IJ catheter removed due to suspected SVC syndrome today by his vascular surgeon Dr. Paulene Floor at [facility] vascular Center today he reportedly had a period of hypoxia after fentanyl and hence advised to come to [facility]. Patient is unable to provide any reasonable history due to dementia his daughters at bedside reports that he's had multiple recent procedures including declotting and thrombectomy of his left arm AV fistula. After these procedures he developed progressive swelling of his neck and face on the 13th and felt to have SVC syndrome subsequently went back and had this left IJ tunneled catheter removed and a new groin HD catheter placed in his right femur today. In addition he is also noted to be anemic with hemoglobin of 7.3, his wife denies any melena, hematemesis or hematochezia. Hemoccults negative, transient hypoxia due to fentanyl resolved in the emergency room itself.  Review of Systems: Unable to obtain due to dementia  No weight loss, night sweats, Fevers, chills, fatigue.  HEENT:  No headaches, Difficulty swallowing,Tooth/dental problems,Sore throat,  No sneezing, itching, ear ache, nasal congestion, post nasal drip,  Cardio-vascular:  No chest pain, Orthopnea, PND, swelling in lower extremities, anasarca, dizziness, palpitations  GI:  No heartburn, indigestion, abdominal pain, nausea, vomiting, diarrhea, change in bowel habits, loss of appetite  Resp:  No shortness of breath with exertion or at rest. No excess mucus, no  productive cough, No non-productive cough, No coughing up of blood.No change in color of mucus.No wheezing.No chest wall deformity  Skin:  no rash or lesions.  GU:  no dysuria, change in color of urine, no urgency or frequency. No flank pain.  Musculoskeletal:  No joint pain or swelling. No decreased range of motion. No back pain.  Psych:  No change in mood or affect. No depression or anxiety. No memory loss.   Past Medical History  Diagnosis Date  . Coronary artery disease   . Myocardial infarction     according to patient  . CVA (cerebral vascular accident)     2 mini strokes  . Arthritis   . GERD (gastroesophageal reflux disease)   . H/O hiatal hernia   . Memory loss   . Hypertension     not on medication  . Shortness of breath     with exertion  . Diabetes mellitus     not on medications  . Depression   . Pneumonia     stayed at [facility]  . Chronic kidney disease     on kidney transplant list  . ESRD (end stage renal disease)     Dialysis T/T/S   Past Surgical History  Procedure Laterality Date  . Dialysis fistula creation    . Coronary artery bypass graft  2002  . Av fistula placement  01/16/2012    Left BVT   . Venoplasty  04/06/2012    Left Basilic Vein Stenosis  . Av fistula placement Left 10/02/2012    Procedure: INSERTION OF ARTERIOVENOUS (AV) GORE-TEX GRAFT ARM;  Surgeon: [name], MD;  Location: [facility] OR;  Service: Vascular;  Laterality: Left;  . Thrombectomy and revision of arterioventous (av) goretex  graft Left 12/16/2012    Procedure: THROMBECTOMY AND REVISION OF ARTERIOVENTOUS (AV) GORETEX  GRAFT;  Surgeon: Fransisco Hertz, MD;  Location: [facility] OR;  Service: Vascular;  Laterality: Left;  Ultrasound guided  . Arteriovenous graft placement    . Laparoscopic partial hepatectomy Right 07/30/12@ [facility]- notes are  in [place]  . Av fistula placement Left 02/01/2013    Procedure: INSERTION OF ARTERIOVENOUS (AV) GORE-TEX GRAFT ARM;  Surgeon: Sherren Kerns, MD;   Location: [facility] OR;  Service: Vascular;  Laterality: Left;  . Fistulogram Left 04/06/2012    Procedure: FISTULOGRAM;  Surgeon: [name], MD;  Location: [facility] CATH LAB;  Service: Cardiovascular;  Laterality: Left;  . Shuntogram N/A 06/01/2012    Procedure: Betsey Amen;  Surgeon: [name], MD;  Location: [facility] CATH LAB;  Service: Cardiovascular;  Laterality: N/A;   Social History:  reports that he has never smoked. He quit smokeless tobacco use about 5 years ago. His smokeless tobacco use included Chew. He reports that he does not drink alcohol or use illicit drugs.  No Known Allergies  Family History  Problem Relation Age of Onset  . Diabetes    . Kidney disease    . Heart disease    . Stroke      Prior to Admission medications   Medication Sig Start Date End Date Taking? Authorizing Provider  allopurinol (ZYLOPRIM) 100 MG tablet Take 100 mg by mouth daily.  02/20/11   Historical Provider, MD  atorvastatin (LIPITOR) 40 MG tablet Take 40 mg by mouth daily. 02/20/11   Iran Ouch, MD  benzonatate (TESSALON) 200 MG capsule Take 200 mg by mouth 3 (three) times daily as needed for cough.    Historical Provider, MD  calcitRIOL (ROCALTROL) 0.5 MCG capsule Take 0.5 mcg by mouth daily.  09/03/12   Historical Provider, MD  citalopram (CELEXA) 40 MG tablet Take by mouth daily.  12/07/12   Historical Provider, MD  clopidogrel (PLAVIX) 75 MG tablet Take 75 mg by mouth daily.    Historical Provider, MD  divalproex (DEPAKOTE ER) 500 MG 24 hr tablet Take 1 tablet (500 mg total) by mouth daily. 11/16/12   Micki Riley, MD  finasteride (PROSCAR) 5 MG tablet Take 5 mg by mouth daily.  12/07/12   Historical Provider, MD  midodrine (PROAMATINE) 5 MG tablet Take by mouth 2 (two) times daily.  12/17/12   Historical Provider, MD  multivitamin (RENA-VIT) TABS tablet Take 1 tablet by mouth daily. 09/09/12   Laveda Norman, MD  NAMENDA 10 MG tablet Take 10 mg by mouth 2 (two) times daily.  02/20/11    Historical Provider, MD  oxyCODONE (ROXICODONE) 5 MG immediate release tablet Take 1-2 tablets (5-10 mg total) by mouth every 4 (four) hours as needed for pain. 02/01/13   Regina J Roczniak, PA-C  ranitidine (ZANTAC) 150 MG tablet Take 150 mg by mouth 2 (two) times daily.    Historical Provider, MD   Physical Exam: Filed Vitals:   2015/03/05 1230 03/05/15 1330 03-05-2015 1335 03/05/15 1400  BP: 104/74 139/66  104/50  Pulse: 83  72   Resp: SpO2: 100%  100%     Wt Readings from Last 3 Encounters:  09/21/14 76.658 kg (169 lb)  04/15/13 81.194 kg (179 lb)  02/01/13 77.599 kg (171 lb 1.2 oz)    General:  Appears calm and comfortable, confused, oriented  to self only, chronically ill-appearing Eyes: PERRL, normal lids, irises & conjunctiva ENT: grossly normal hearing, lips & tongue, dressing over left chest HD catheter site Neck: no LAD, masses or thyromegaly Cardiovascular: RRR, no m/r/g. No LE edema. Telemetry: SR, no arrhythmias  Respiratory: CTA bilaterally, no w/r/r. Normal respiratory effort. Abdomen: soft, ntnd Skin: no rash or induration seen on limited exam Musculoskeletal: grossly normal tone BUE/BLE Psychiatric: Unable to assess Neuro: non focal          Labs on Admission:  Basic Metabolic Panel:  Recent Labs Lab 2015/02/26 1220  NA 137  K 3.7  CL 100*  CO2 27  GLUCOSE 131*  BUN 30*  CREATININE 6.69*  CALCIUM 9.5   Liver Function Tests: No results for input(s): AST, ALT, ALKPHOS, BILITOT, PROT, ALBUMIN in the last 168 hours. No results for input(s): LIPASE, AMYLASE in the last 168 hours. No results for input(s): AMMONIA in the last 168 hours. CBC:  Recent Labs Lab 02/26/2015 1220  WBC 5.9  NEUTROABS 4.7  HGB 7.3*  HCT 22.5*  MCV 97.0  PLT 127*   Cardiac Enzymes: No results for input(s): CKTOTAL, CKMB, CKMBINDEX, TROPONINI in the last 168 hours.  BNP (last 3 results) No results for input(s): BNP in the last 8760 hours.  ProBNP (last 3  results) No results for input(s): PROBNP in the last 8760 hours.  CBG: No results for input(s): GLUCAP in the last 168 hours.  Radiological Exams on Admission: Dg Chest Port 1 View  2015/02/26   CLINICAL DATA:  Shortness of breath, history coronary artery disease post MI and CABG, stroke, hypertension, diabetes, end-stage renal disease  EXAM: PORTABLE CHEST - 1 VIEW  COMPARISON:  Portable exam 1200 hours compared 02/08/2015  FINDINGS: Enlargement of cardiac silhouette post CABG.  RIGHT subclavian stent again identified.  Additional stents identified both upper extremities.  Atherosclerotic calcification aorta.  Bibasilar atelectasis.  No definite infiltrate pleural effusion or pneumothorax.  No acute osseous findings.  IMPRESSION: Enlargement of cardiac silhouette post CABG.  Bibasilar atelectasis.   Electronically Signed   By: Ulyses Southward M.D.   On: 02/26/15 12:16    Assessment/Plan  1.  SVC syndrome -Suspected due to multiple interventions and possible stenosis of the proximal vasculature -Status post removal of left IJ tunneled HD catheter today -Will ask vascular to evaluate and determine if any further intervention or treatment needed at this point -? Anticoagulation/?CT  2. Anemia due to chronic disease -No overt bleeding except wife reports increased bleeding after he is stuck at dialysis -Hemoccult-negative - will check an anemia panel, transfuse if hemoglobin drops less than 7  3. Dementia with confusion and baseline -Continue Namenda  4. ESRD on HD MWF -followed by Dr. Hyman Hopes and Dr. Allena Katz -notified Renal  5. S/p CABG  6. H/o CVA -continue plavix  Code Status: DNR, according to his living will per wife Family Communication: wife and daughters at bedside Disposition Plan: inpatient  Time spent:  [facility] Pager [phone]

## 2015-02-09 NOTE — Progress Notes (Addendum)
Admission note:  Arrival Method:   Patient arrived from ED on stretcher with family and staff accompanying. Mental Orientation:  Alert to self. Telemetry: N/A Assessment: See doc flow sheets. Skin: Dry scabs noted on the right upper arm from dialysis needles.  Left chest dressing noted from the catheter removal.  Dressing noted on the right femoral from the new catheter.  Dry, warm and intact.  Facial swelling, neck adn left upper arm swelling noted.  Assessed with CN Allyson.   IV: Left AC saline lock. Pain: Denies any pain currently. Tubes: N/A Safety Measures: Bed alarm, phone and call light within reach.  Bed in low position. Fall Prevention Safety Plan: Reviewed with patient, understood and acknowledged. Admission Screening: In progress. 6700 Orientation: Patient has been oriented to the unit, staff and to the room.

## 2015-02-09 NOTE — ED Notes (Signed)
Per EMS: pt from [facility] vascular center for eval of facial swelling and low oxygen sats. Pt recently being treated for Blood clot in right arm, was at dialysis for eval of facial and neck swelling following catheter placement to right chest. Catheter was removed during procedure where pt was given of fentanyl and new catheter placed in right femoral. Pt began to desat to 80% started on non rebreather and improved to 100%. Per family pt is confused at baseline, EMS noted crackles to lung sounds,  Nad noted.

## 2015-02-09 NOTE — Progress Notes (Signed)
ANTICOAGULATION CONSULT NOTE - Initial Consult  Pharmacy Consult for Heparin Indication: subclavian thrombus, occlusive  No Known Allergies  Patient Measurements:   Heparin Dosing Weight: 76.7 kg   Vital Signs: Temp: 98.2 F (36.8 C) (06/16 1727) Temp Source: Oral (06/16 1727) BP: 123/55 mmHg (06/16 1727) Pulse Rate: 60 (06/16 1727)  Labs:  Recent Labs  02/20/2015 1220 02/23/2015 1530  HGB 7.3* 8.2*  HCT 22.5* 25.4*  PLT 127* 115*  APTT 45*  --   LABPROT 16.3*  --   INR 1.30  --   CREATININE 6.69* 7.00*    CrCl cannot be calculated (Unknown ideal weight.).   Medical History: Past Medical History  Diagnosis Date  . Coronary artery disease   . Myocardial infarction     according to patient  . CVA (cerebral vascular accident)     2 mini strokes  . Arthritis   . GERD (gastroesophageal reflux disease)   . H/O hiatal hernia   . Memory loss   . Hypertension     not on medication  . Shortness of breath     with exertion  . Diabetes mellitus     not on medications  . Depression   . Pneumonia     stayed at [facility]  . Chronic kidney disease     on kidney transplant list  . ESRD (end stage renal disease)     Dialysis T/T/S  . Anemia 02/18/2015    Medications:  Prescriptions prior to admission  Medication Sig Dispense Refill Last Dose  . calcium acetate (PHOSLO) 667 MG capsule Take 667 mg by mouth 3 (three) times daily with meals.    02/08/2015 at Unknown time  . clopidogrel (PLAVIX) 75 MG tablet Take 75 mg by mouth daily.   02/08/2015 at Unknown time  . divalproex (DEPAKOTE ER) 500 MG 24 hr tablet Take 1 tablet (500 mg total) by mouth daily. (Patient taking differently: Take 1,000 mg by mouth daily. ) 30 tablet 3 02/08/2015 at Unknown time  . mirtazapine (REMERON) 15 MG tablet Take 15 mg by mouth daily.   02/08/2015 at Unknown time  . multivitamin (RENA-VIT) TABS tablet Take 1 tablet by mouth daily. 30 tablet 0 02/08/2015 at Unknown time  . omeprazole (PRILOSEC) 20  MG capsule Take 20 mg by mouth daily.   02/08/2015 at Unknown time  . oxyCODONE-acetaminophen (PERCOCET/ROXICET) 5-325 MG per tablet Take 1 tablet by mouth every 6 (six) hours as needed for moderate pain.    02/08/2015 at Unknown time  . calcitRIOL (ROCALTROL) 0.5 MCG capsule Take 0.5 mcg by mouth every Monday, Wednesday, and Friday with hemodialysis.    02/08/2015    Assessment: 81 y/o M was admitted with facial swelling and SOB (low O2 sats after receiving Fenanyl during a procedure). Patient has ESRD (MWF). He had been having a lot of access issues recently with 4 declots of R AVG in the past 10 days  and tunneled dialysis cath placed by Dr Juel Burrow at [facility] 6/14. Cath removed today 6/16--new Rt femoral cath placed. Sent to ED for eval and treatment for probable SVC syndrome. Now scheduled for SVC venogram with thrombolysis/thrombectomy with poss pta/stent on 6/17. Plan to start IV heparin for occlusive subclavian thrombus. Baseline Hgb 7.3 with ACD. Baseline plts 115. Patient is noted by nephrology to have had some recent prolonged bleeding after HD.   Goal of Therapy:  Heparin level 0.3-0.7 units/ml Monitor platelets by anticoagulation protocol: Yes   Plan:  D/c SQ heparin (  none administered) Heparin 4000 unit IV bolus Heparin infusion at 1300 units/hr Heparin level in 8 hrs Daily HL and CBC.  Misty Stanley Stillinger 02/17/2015,7:43 PM

## 2015-02-09 NOTE — Plan of Care (Signed)
RN paged with the results of CT called to her by radiologist. NP reviewed chart notes and CT results. IR saw pt today and have a planned procedure for tomorrow, but this NP wanted to make sure IR was aware of CT results. This NP paged and spoke to IR on call, Dr. Priscille Kluver, and relayed results of CT. He stated unless pt became significantly worse tonight with more swelling, the procedure could occur in the am. Pt started on Heparin drip at his orders. IR said no need to hold Heparin in am prior to procedure that they would adjust in am.  Plavix held while on drip. RN called back and made aware of plan.  Jimmye Norman, NP [facility]

## 2015-02-24 NOTE — Significant Event (Signed)
Rapid Response Event Note Called per floor RN regarding Pt with "change in voice" and worsening facial swelling. Pt admitted on 6/16 for facial swelling and SVC syndrome. CTA completed last night positive for thrombus, schedule for IR thrombolysis tomorrow.    Overview: Time Called: 0346 Arrival Time: 0350 Event Type: Respiratory  Initial Focused Assessment: Pt pale in complexion, swollen face and eyes. Pt respirations trended to agonal breathing and then stopped within 5 minutes of my arrival.  Interventions: Oral suction provided. DNR status confirmed. K..Kirby [facility] NP paged to bedside.  Time of Death 0400 pronounced by Clinical research associate Nehemiah Settle White RN) and Tempie Donning RN. Family notified per NP. Floor RN to prepare Pt body for morgue.   Event Summary: Name of Physician Notified: Donnamarie Poag NP  at 0350    at    Outcome: Code status clarified  Pt expired at 0400  Event End Time: 0415  Lolita Rieger Benney [place]

## 2015-02-24 NOTE — Discharge Summary (Addendum)
Death Summary  Shane BalesJohn R Emme ZOX:[id number] DOB: 07-17-1931 DOA: 02/22/2015  PCP: Noni SaupeEDDING II,[name], MD  Admit date: 02/19/2015 Date of Death: 2015/06/21  Final Diagnoses:  Active Problems:   Anemia   SVC syndrome possibly from occlusive thrombus   SVC (superior vena cava obstruction)   ESRD   Dementia   LBBB  History of present illness:  Chief Complaint: low O2 after procedure and SVC syndrome  HPI: Shane BalesJohn R [name] is a 81 y.o. male with Complicated past medical history significant for ESRD on hemodialysis, extensive access problems, dementia with confusion, status post CABG had a new groin HD catheter placed and his left tunneled IJ catheter removed due to suspected SVC syndrome today by his vascular surgeon [facility] today he reportedly had a period of hypoxia after fentanyl and hence advised to [facility]. Patient is unable to provide any reasonable history due to dementia his daughters at bedside reports that he's had multiple recent procedures including declotting and thrombectomy of his left arm AV fistula. After these procedures he developed progressive swelling of his neck and face on the 13th and felt to have SVC syndrome subsequently went back and had this left IJ tunneled catheter removed and a new groin HD catheter placed in his right femur today. In addition he is also noted to be anemic with hemoglobin of 7.3, his wife denies any melena, hematemesis or hematochezia.  Hospital Course:  He was  Admitted to the Renal floor 6/16 evening, was seen by Interventional radiology in consult and Vascular surgery in consult same evening, was stable at the time of admission, Started on IV heparin overnight CTA ordered and scheduled to have SVC venogram with thrombolysis/thrombectomy with poss pta/stent on 6/17 am. But at 4am, suddenly became bradycardic and agonal breathing per staff notes and expired within 5 minutes.  Time:  45min  Signed:  Christeena Krogh  [facility] 02/22/2015, 4:02 PM

## 2015-02-24 NOTE — Progress Notes (Signed)
Patient expired during night shift. Unable to complete thorough assessment as patient is deceased. Multiple family members at bedside. Comfort cart requested. Will complete post mortem checklist as information is received.  Leanna Battles, RN.

## 2015-02-24 NOTE — Progress Notes (Signed)
Nurse Tech called RN into patient's room. Patient was gurgling, voice fading, eye lid swollen, charge nurse notified, rapid response RN called. NP on call notified as well. Rapid response RN and charge nurse present at bedside. Code status confirmed as DNR. Patient's respirations went into agonal breathing, respiration slowed down. Patient took last breath, pronounced dead by 2 RNs, Rapid response nurse Si Gaul and Carin Hock at [employer]. Family notified, [employer] [facility] notified.

## 2015-02-24 NOTE — Progress Notes (Deleted)
Nurse Tech called RN to room that patient was trying to get up again. RN went in there, spoke with patient, patient states my oxygen is not in'',patient on 2L of Oxygen nasal canula, O2 sat equals 100.

## 2015-02-24 NOTE — Progress Notes (Signed)
Per family, patient will try to get up. Patient tried to move out of bed several times after family left. Patient moved to a camera room for safety.

## 2015-02-24 NOTE — Progress Notes (Signed)
RN paged this NP because pt was struggling to breathe and couldn't speak. RN had paged rapid response as well. Shortly after RRRN arrived, the pt expired suddenly. RRRN said pt had very sudden swelling of his face and eyes then passed away (pt here with Subclavian syndrome with thrombus-see this NP's previous note tonight). Suspect he occluded further or threw an embolus. He had no increased swelling during shift except this acute event.  He was a DNR. Pronounced by 2 RNs. Time of death 0400.  This NP attempted to call spouse but no answer on either phone. This NP called and spoke to pt's daughter, Talbert Nan, and informed her of pt's passing. Lurena Joiner said she will send someone to get/tell her mom and didn't want me to try to call her mother again. Family will come to the hospital.  Death certificate completed.  Jimmye Norman, NP [facility]

## 2015-02-24 NOTE — Progress Notes (Signed)
Nurse Tech called RN into room that patient was trying to get out of bed again. RN spoke with patient, patient states ''my oxygen is not in''. Patient on 2 L of oxygen nasal canula. Checked patient's oxygenation, O2 sat equals 100 on 2 L. RN asked patient if he feels short of breath, patient verbalized no.

## 2015-02-24 DEATH — deceased

## 2015-09-09 IMAGING — CT CT CHEST W/ CM
2 of 3 series · 11 of 36 positions shown, 13 images · IV contrast (Iodine)
Comparison: Chest radiograph performed earlier today at [DATE] p.m.

CLINICAL DATA: Decreased O2 saturation. Known right subclavian and
SVC stent graft. Assess for SVC obstruction.

EXAM:
CT CHEST WITH CONTRAST
TECHNIQUE: Multidetector CT imaging of the chest was performed during
intravenous contrast administration.
CONTRAST:  100mL OMNIPAQUE IOHEXOL 300 MG/ML  SOLN

[Series 201: chest with, idose (2) · axial · 0.71mm/px · z∈[-274,+6]mm · 8 of 66 slices shown, 10 images]
[im 5/66  mediastinal]
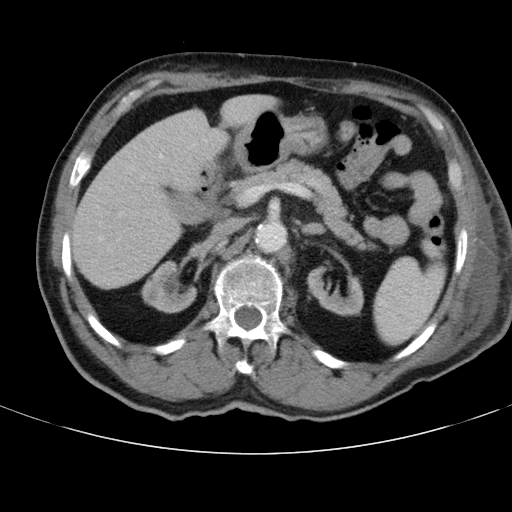
[im 5/66  lung]
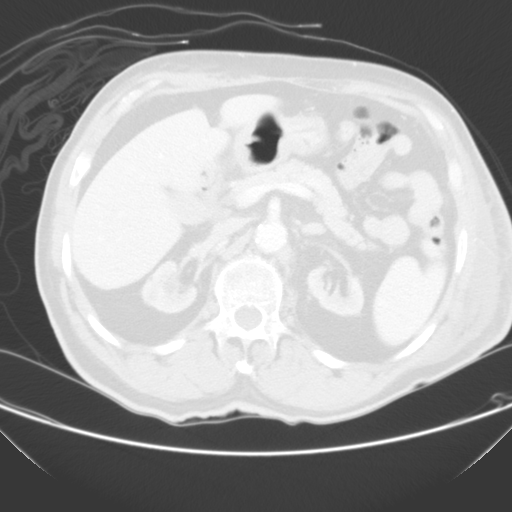
[im 13/66  lung]
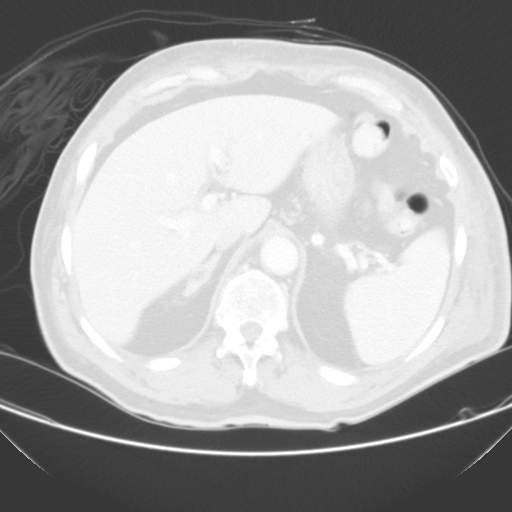
[im 22/66  lung]
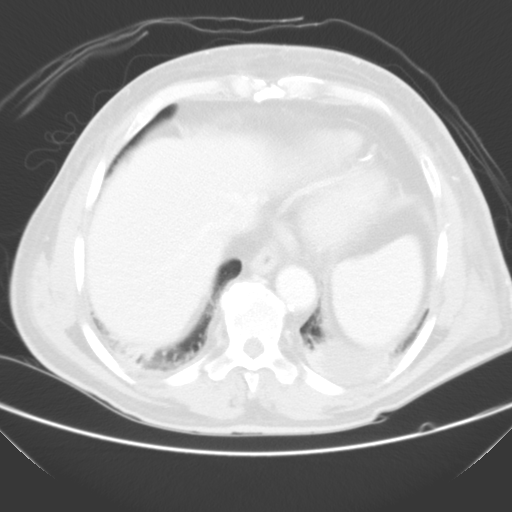
[im 29/66  lung]
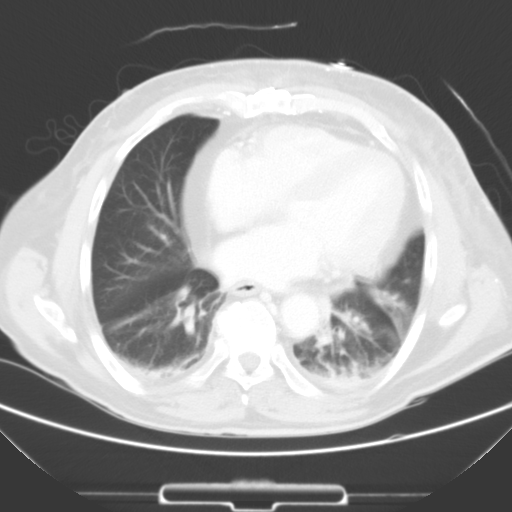
[im 37/66  mediastinal]
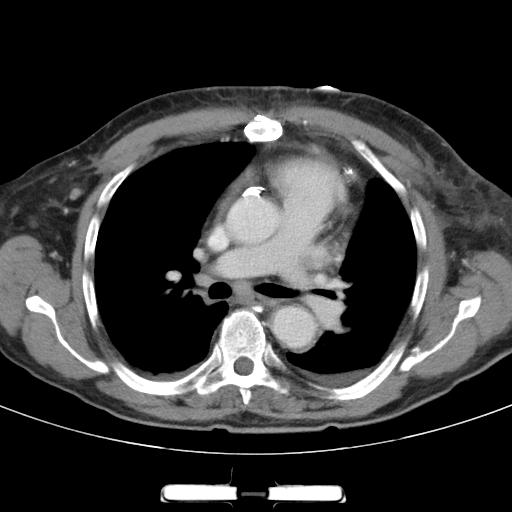
[im 37/66  lung]
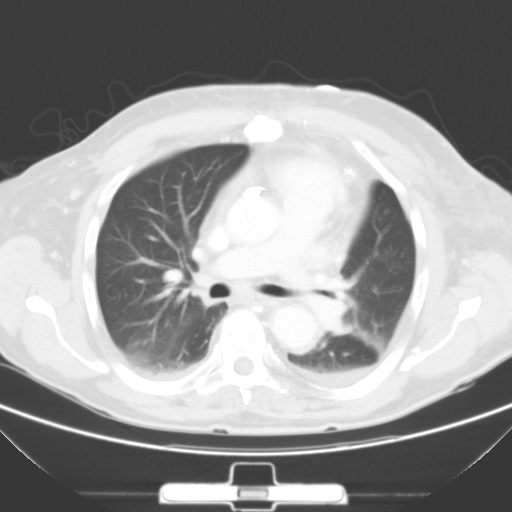
[im 44/66  lung]
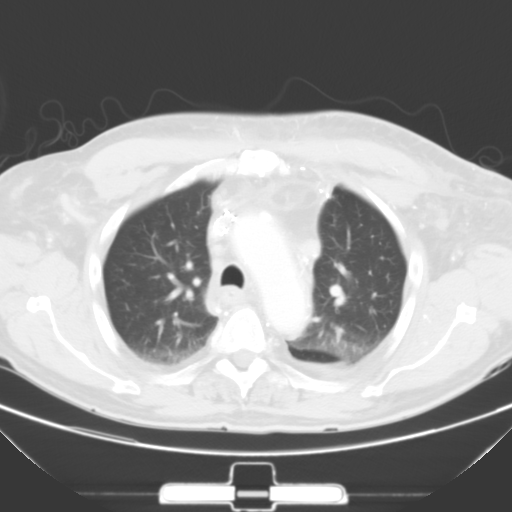
[im 53/66  lung]
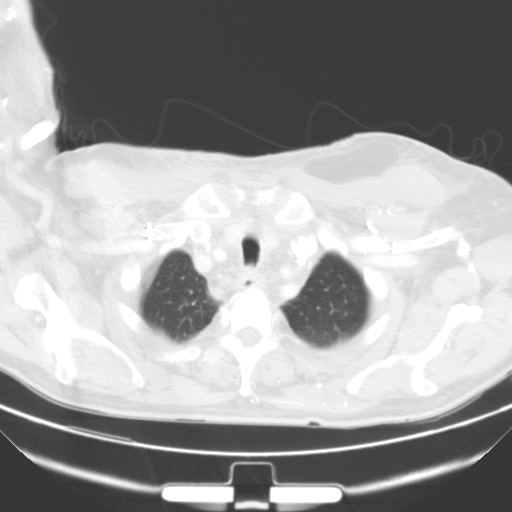
[im 61/66  lung]
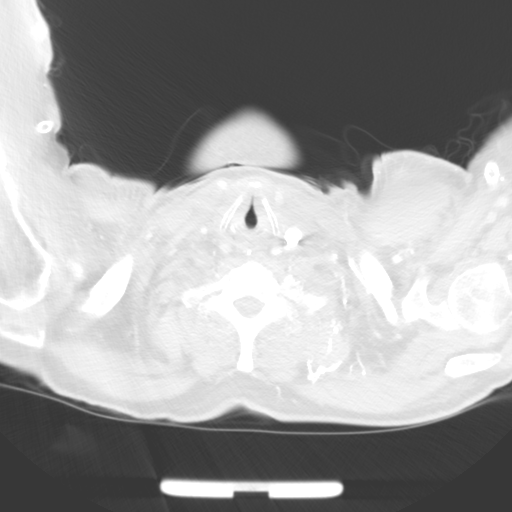

[Series 203: coronal, idose (2) · coronal · 0.45mm/px · 3 of 144 slices shown]
[im 29/144  lung]
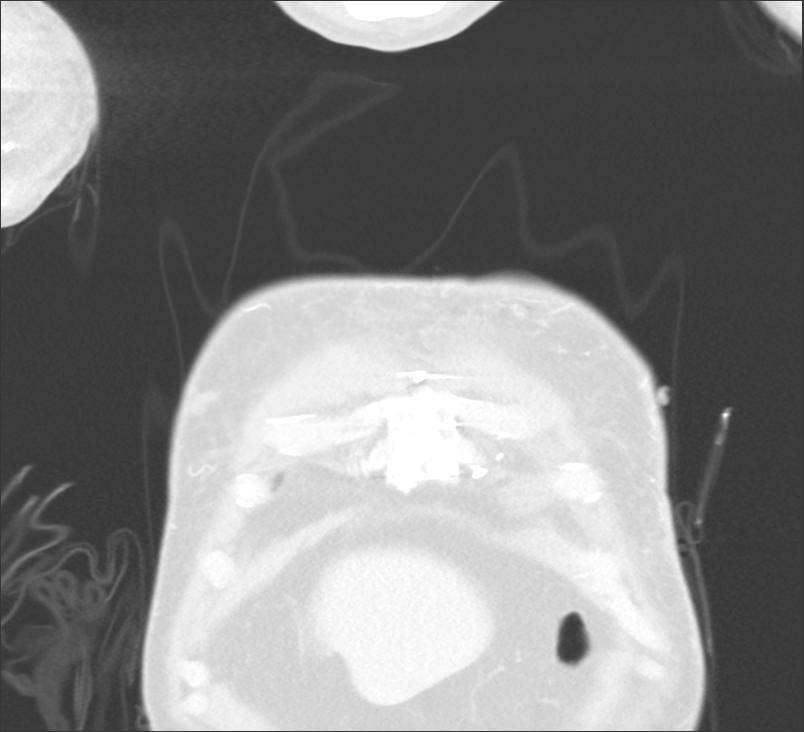
[im 58/144  lung]
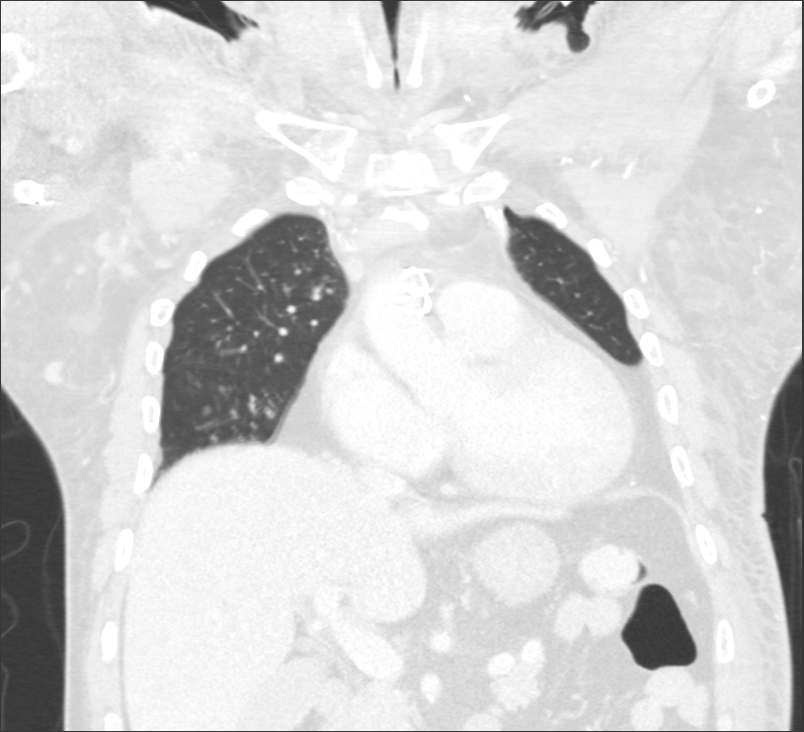
[im 86/144  lung]
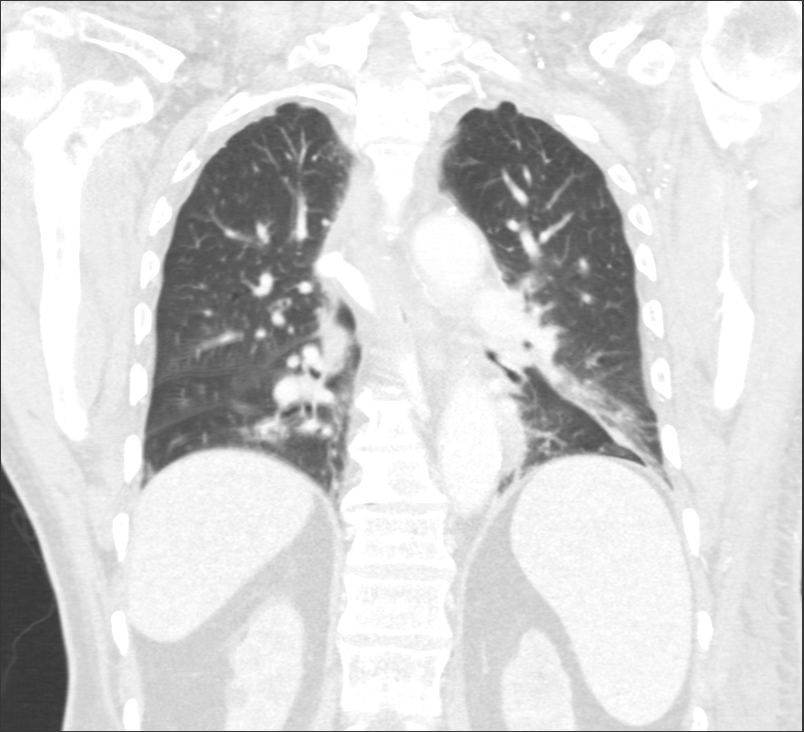

[11 of 36 positions shown; findings below may reference images not displayed]

FINDINGS: There is focal kinking at the stent overlap along the right
subclavian vein and SVC. There is likely occlusive thrombus within
the subclavian portion of the stent, and nonocclusive clot is seen
extending along the SVC portion of the stent. Thrombus is also noted
partially filling the left brachiocephalic vein, with occlusion of
the distal left brachiocephalic vein as it joins the SVC.
Nonocclusive thrombus is seen extending more inferiorly along the
SVC, though the distal SVC appears fully patent.

There is underlying marked narrowing of the right internal jugular
vein, of uncertain significance. This may reflect the significantly
decreased flow within the SVC.

The patient's right axillary stent is not well assessed. However,
significant surrounding soft tissue inflammation and edema is seen
at the level of the axilla, raising concern for underlying
occlusion.

A trace left pleural effusion is noted. Patchy bibasilar airspace
opacities, left greater than right, may reflect atelectasis or
possibly pneumonia. No pneumothorax is seen. No suspicious masses
are identified.

The heart is mildly enlarged. Diffuse coronary artery calcifications
are seen. Scattered periaortic and right paratracheal nodes measure
up to 1.1 cm in short axis. No pericardial effusion is identified.
The great vessels are grossly unremarkable in appearance. The
thyroid gland is within normal limits. No axillary lymphadenopathy
is seen. The patient is status post median sternotomy.

There is mild diffuse soft tissue edema along the anterior chest
wall, tracking inferiorly along the axilla bilaterally. Focal skin
thickening is noted along the left lateral chest wall, of uncertain
significance.

Scattered small hypodensities are noted throughout the liver, of
uncertain significance. Some of these likely reflect cysts. The
spleen is unremarkable in appearance. The visualized portions of the
pancreas, adrenal glands and gallbladder are unremarkable. Bilateral
renal atrophy is noted, with scattered bilateral renal cysts.
Scattered calcification is noted along the proximal abdominal aorta
and its branches.

No acute osseous abnormalities are seen.
IMPRESSION: 1. Focal kinking at the stent overlap along the right subclavian
vein and SVC. Likely occlusive thrombus within the subclavian
portion of the stent, and nonocclusive clot along the SVC portion of
the stent. Thrombus noted partially filling the left brachiocephalic
vein, with occlusion of the distal left brachiocephalic vein with
joins the SVC. Nonocclusive thrombus noted extending more inferiorly
along the SVC, though the distal SVC appears fully patent.
Underlying marked narrowing of the right internal jugular vein may
reflect the significantly decreased flow within the SVC.
2. Right axillary stent not well assessed. However, significant
surrounding soft tissue inflammation and edema are noted at the
level of the axilla, raising concern for underlying occlusion.
3. Trace left pleural effusion noted. Patchy bibasilar airspace
opacities, left greater than right, may reflect atelectasis or
possibly pneumonia.
4. Mild cardiomegaly noted.
5. Diffuse coronary artery calcifications seen.
6. Scattered periaortic and right paratracheal nodes measure up to
1.1 cm in short axis.
7. Mild diffuse soft tissue edema along the anterior chest wall,
tracts and inferiorly along the axilla bilaterally. Focal skin
thickening along the left lateral chest wall.
8. Scattered nonspecific small hypodensities throughout the liver.
9. Bilateral renal atrophy, with scattered bilateral renal cysts.
10. Scattered calcification along the proximal abdominal aorta and
its branches.

These results were called by telephone at the time of interpretation
on 02/09/2015 at [DATE] to [id number], who verbally
acknowledged these results.
